# Patient Record
Sex: Male | Born: 1980 | Race: Black or African American | Hispanic: No | Marital: Single | Smoking: Former smoker
Health system: Southern US, Community
[De-identification: ages and names within clinical notes are randomized; demographics above are authoritative.]

## PROBLEM LIST (undated history)

## (undated) DIAGNOSIS — K5190 Ulcerative colitis, unspecified, without complications: Secondary | ICD-10-CM

## (undated) DIAGNOSIS — K921 Melena: Secondary | ICD-10-CM

## (undated) HISTORY — PX: WISDOM TOOTH EXTRACTION: SHX21

## (undated) HISTORY — DX: Melena: K92.1

---

## 1999-10-20 ENCOUNTER — Emergency Department (HOSPITAL_COMMUNITY): Admission: EM | Admit: 1999-10-20 | Discharge: 1999-10-20 | Payer: Self-pay | Admitting: Emergency Medicine

## 2007-08-12 ENCOUNTER — Emergency Department (HOSPITAL_COMMUNITY): Admission: EM | Admit: 2007-08-12 | Discharge: 2007-08-12 | Payer: Self-pay | Admitting: Emergency Medicine

## 2010-07-11 ENCOUNTER — Emergency Department (HOSPITAL_COMMUNITY): Admission: EM | Admit: 2010-07-11 | Discharge: 2010-07-12 | Payer: Self-pay | Admitting: Emergency Medicine

## 2010-07-13 ENCOUNTER — Emergency Department (HOSPITAL_COMMUNITY): Admission: EM | Admit: 2010-07-13 | Discharge: 2010-07-13 | Payer: Self-pay | Admitting: Emergency Medicine

## 2010-07-13 ENCOUNTER — Emergency Department (HOSPITAL_COMMUNITY): Admission: EM | Admit: 2010-07-13 | Discharge: 2010-07-13 | Payer: Self-pay | Admitting: Family Medicine

## 2010-11-20 LAB — CSF CELL COUNT WITH DIFFERENTIAL
RBC Count, CSF: 10 /mm3 — ABNORMAL HIGH
Tube #: 4

## 2010-11-20 LAB — POCT I-STAT, CHEM 8
Calcium, Ion: 1.09 mmol/L — ABNORMAL LOW (ref 1.12–1.32)
Chloride: 105 mEq/L (ref 96–112)
Glucose, Bld: 95 mg/dL (ref 70–99)
HCT: 44 % (ref 39.0–52.0)
TCO2: 25 mmol/L (ref 0–100)

## 2011-03-13 IMAGING — CT CT HEAD W/O CM
1 series · 16 of 30 positions shown, 20 images · non-contrast
Comparison: None

CLINICAL DATA: Lightheaded, near-syncope, pain

CT HEAD WITHOUT CONTRAST
TECHNIQUE: Contiguous axial images were obtained from the base of
the skull through the vertex without contrast.

[Series 2: head routine 4.8 h37s · axial · 0.46mm/px · z∈[+1050,+1204]mm · 16 of 36 slices shown, 20 images]
[im 2/36  brain]
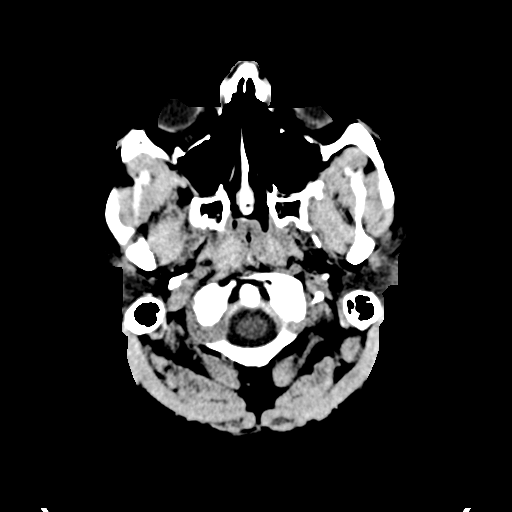
[im 2/36  bone]
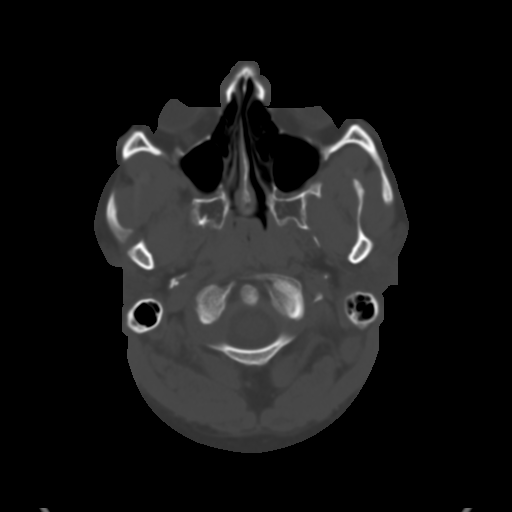
[im 4/36  brain]
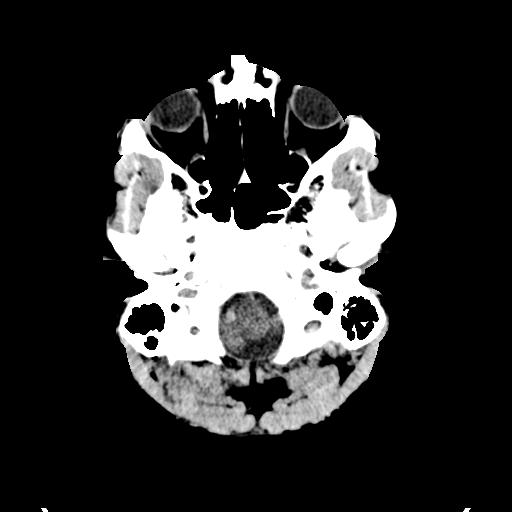
[im 7/36  brain]
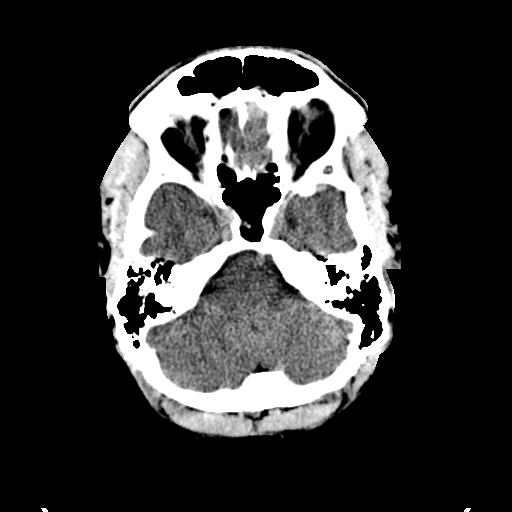
[im 9/36  brain]
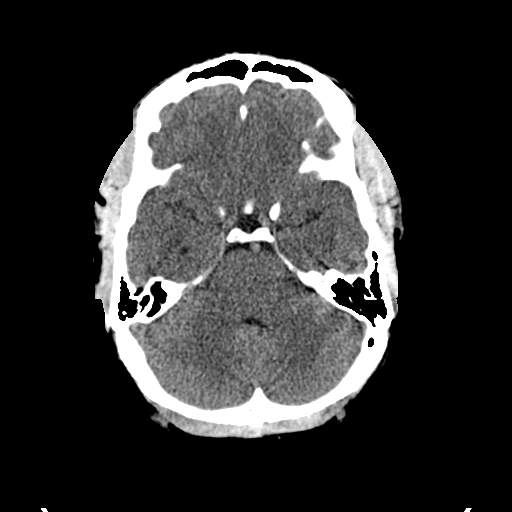
[im 10/36  brain]
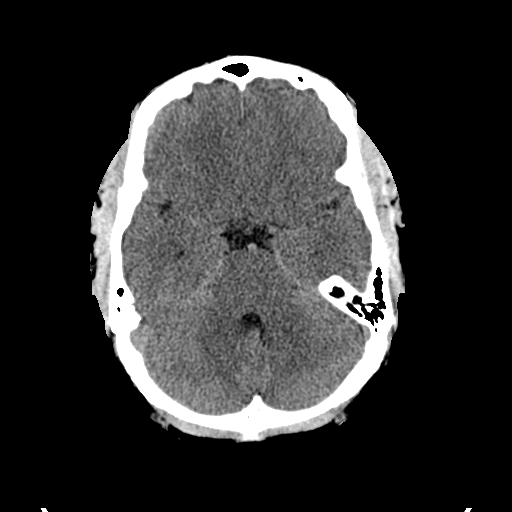
[im 10/36  bone]
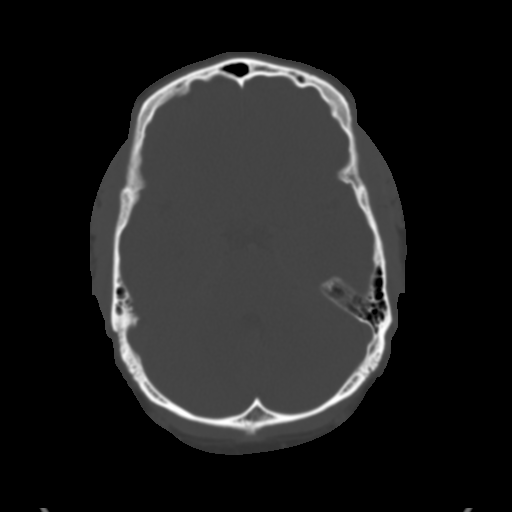
[im 13/36  brain]
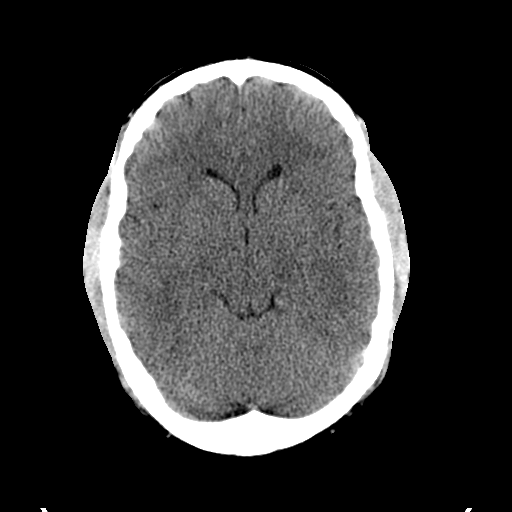
[im 15/36  brain]
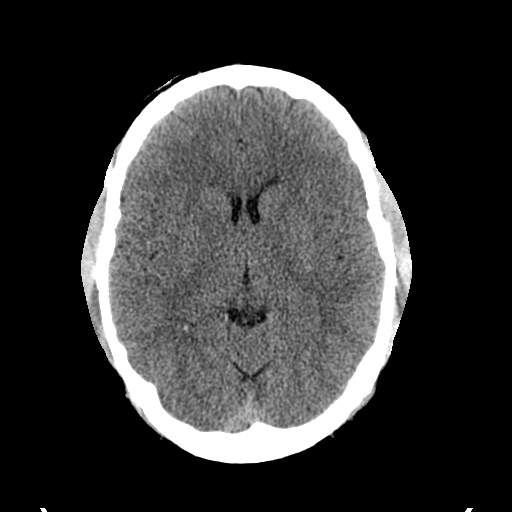
[im 17/36  brain]
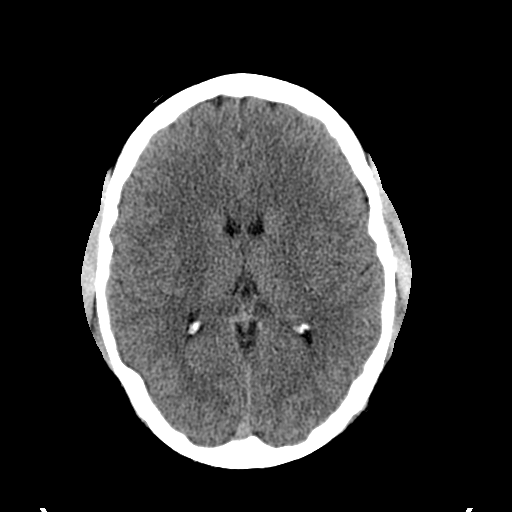
[im 19/36  brain]
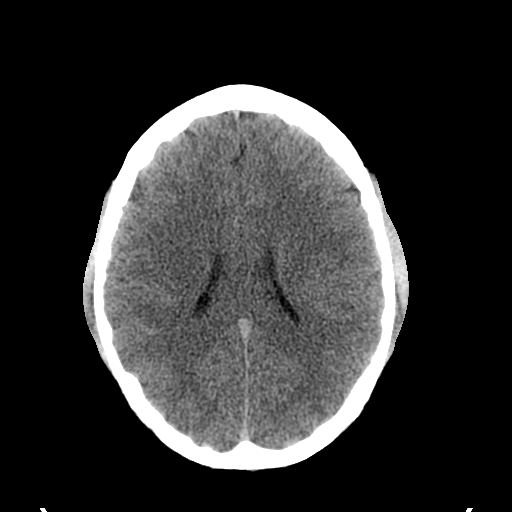
[im 19/36  bone]
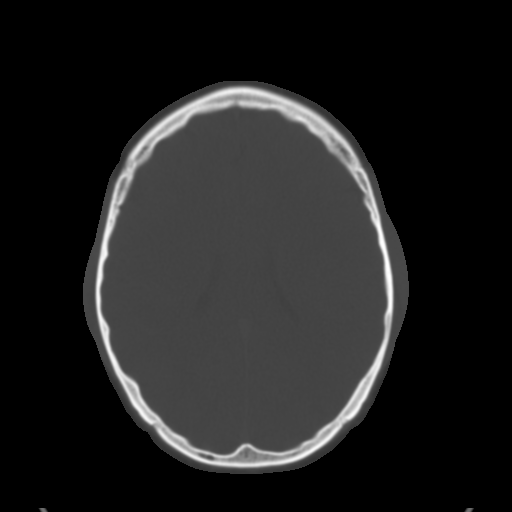
[im 21/36  brain]
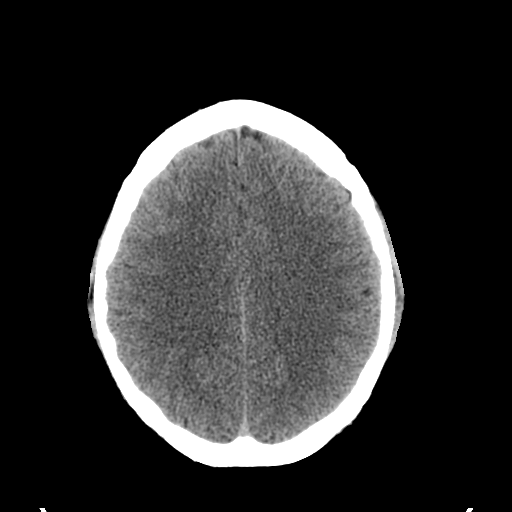
[im 23/36  brain]
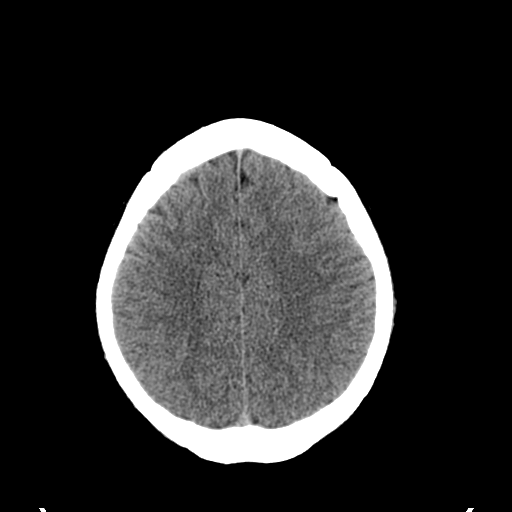
[im 26/36  brain]
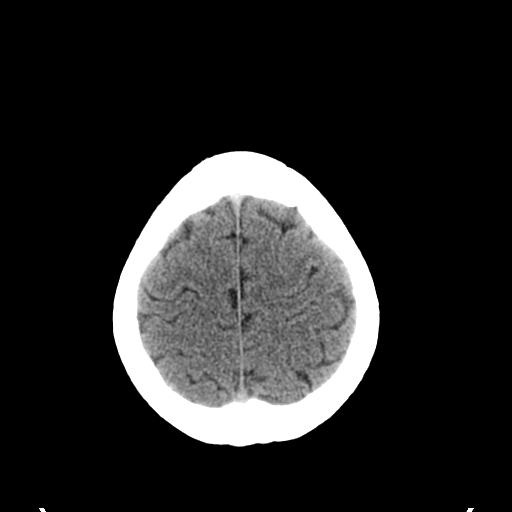
[im 27/36  brain]
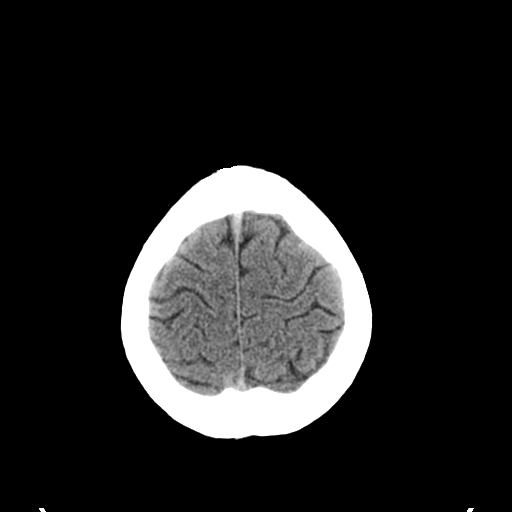
[im 27/36  bone]
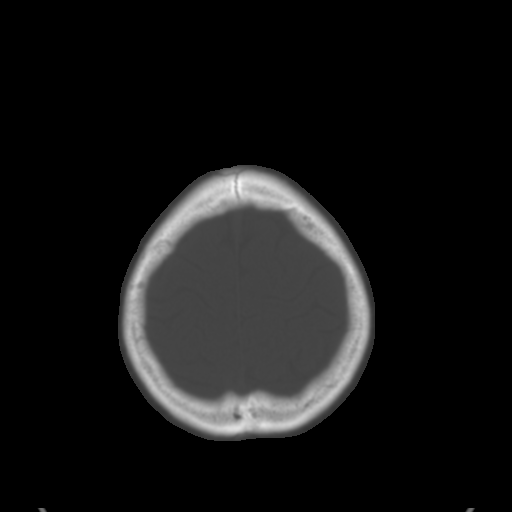
[im 29/36  brain]
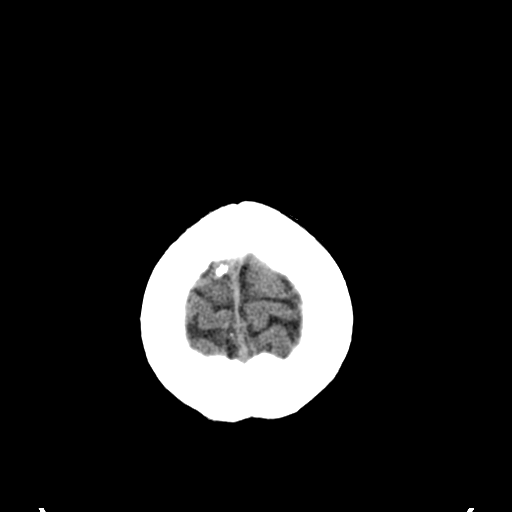
[im 32/36  brain]
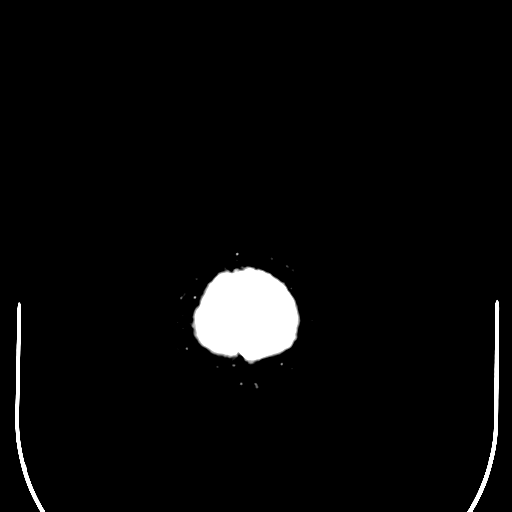
[im 34/36  brain]
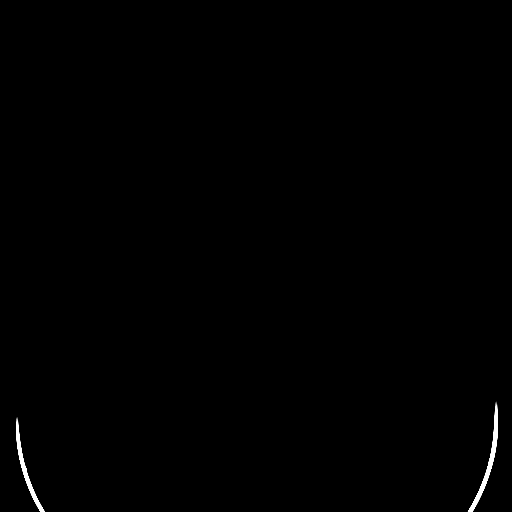

[16 of 30 positions shown; findings below may reference images not displayed]

FINDINGS: Normal ventricular morphology.
No midline shift or mass effect.
Normal appearance of brain parenchyma.
No intracranial hemorrhage, mass lesion, or acute infarction.
Visualized paranasal sinuses and mastoid air cells clear.
Bones unremarkable.
IMPRESSION: No acute intracranial abnormalities.
If patient has persistent symptoms, recommend follow-up MR imaging
of the brain with and without contrast to further assess.

## 2012-05-27 ENCOUNTER — Encounter (HOSPITAL_COMMUNITY): Payer: Self-pay

## 2012-05-27 ENCOUNTER — Emergency Department (INDEPENDENT_AMBULATORY_CARE_PROVIDER_SITE_OTHER)
Admission: EM | Admit: 2012-05-27 | Discharge: 2012-05-27 | Disposition: A | Discharge status: Home / Self Care | Source: Home / Self Care | Attending: Emergency Medicine | Admitting: Emergency Medicine

## 2012-05-27 DIAGNOSIS — J069 Acute upper respiratory infection, unspecified: Secondary | ICD-10-CM

## 2012-05-27 DIAGNOSIS — R0981 Nasal congestion: Secondary | ICD-10-CM

## 2012-05-27 DIAGNOSIS — J3489 Other specified disorders of nose and nasal sinuses: Secondary | ICD-10-CM

## 2012-05-27 HISTORY — DX: Ulcerative colitis, unspecified, without complications: K51.90

## 2012-05-27 MED ORDER — ACETAMINOPHEN-CODEINE #3 300-30 MG PO TABS: 1.0000 | ORAL_TABLET | Freq: Four times a day (QID) | ORAL | Status: DC | PRN

## 2012-05-27 MED ORDER — FEXOFENADINE-PSEUDOEPHED ER 60-120 MG PO TB12: 1.0000 | ORAL_TABLET | Freq: Two times a day (BID) | ORAL | Status: DC

## 2012-05-27 NOTE — ED Provider Notes (Signed)
Or History     CSN: [id number]  Arrival date & time 05/27/12  1503   First MD Initiated Contact with Patient 05/27/12 1542      Chief Complaint  Patient presents with  . Sore Throat  . Sinus Problem    (Consider location/radiation/quality/duration/timing/severity/associated sxs/prior treatment) HPI Comments: Patient presents urgent care complaining of sinus congestion headache runny nose sore throat for approximately 5 days. Described that he has had some tactile fevers at home and had a temperature of 1.100.2. Have been taken over-the-counter decongestants such as DayQuil and NyQuil. Denies any cough or difficulty breathing. No abdominal pain  Patient is a 31 y.o. male presenting with pharyngitis and sinus complaint. The history is provided by the patient.  Sore Throat This is a new problem. The current episode started more than 2 days ago. The problem occurs constantly. The problem has been gradually worsening. Pertinent negatives include no chest pain, no abdominal pain, no headaches and no shortness of breath. Nothing aggravates the symptoms. Nothing relieves the symptoms. He has tried nothing for the symptoms. The treatment provided no relief.  Sinus Problem Pertinent negatives include no chest pain, no abdominal pain, no headaches and no shortness of breath.    Past Medical History  Diagnosis Date  . Ulcerative colitis     History reviewed. No pertinent past surgical history.  No family history on file.  History  Substance Use Topics  . Smoking status: Current Every Day Smoker  . Smokeless tobacco: Not on file  . Alcohol Use: Yes      Review of Systems  Constitutional: Negative for chills, activity change and appetite change.  HENT: Positive for congestion, sore throat, rhinorrhea and sinus pressure. Negative for hearing loss, ear pain, facial swelling, trouble swallowing, neck pain, neck stiffness and postnasal drip.   Respiratory: Negative for cough and shortness  of breath.   Cardiovascular: Negative for chest pain.  Gastrointestinal: Negative for abdominal pain.  Skin: Negative for rash and wound.  Neurological: Negative for headaches.    Allergies  Review of patient's allergies indicates no known allergies.  Home Medications   Current Outpatient Rx  Name Route Sig Dispense Refill  . MESALAMINE 1.2 G PO TBEC Oral Take 1,200 mg by mouth 4 (four) times daily.    . ACETAMINOPHEN-CODEINE #3 300-30 MG PO TABS Oral Take 1-2 tablets by mouth every 6 (six) hours as needed for pain. 15 tablet 0  . FEXOFENADINE-PSEUDOEPHED ER 60-120 MG PO TB12 Oral Take 1 tablet by mouth every 12 (twelve) hours. 14 tablet 0    BP 123/71  Pulse 92  Temp 98.7 F (37.1 C) (Oral)  Resp 24  SpO2 99%  Physical Exam  Vitals reviewed. Constitutional: Vital signs are normal. He appears well-developed.  Non-toxic appearance. He does not have a sickly appearance. He does not appear ill. No distress.  HENT:  Head: Normocephalic.  Right Ear: Tympanic membrane normal.  Left Ear: Tympanic membrane normal.  Mouth/Throat: Uvula is midline and mucous membranes are normal. Posterior oropharyngeal erythema present. No oropharyngeal exudate or tonsillar abscesses.  Eyes: Conjunctivae normal are normal.  Neck: Neck supple. No JVD present.  Cardiovascular: Normal rate.  Exam reveals no gallop and no friction rub.   No murmur heard. Pulmonary/Chest: Effort normal and breath sounds normal. No respiratory distress. He has no decreased breath sounds. He has no wheezes. He has no rales.  Lymphadenopathy:    He has no cervical adenopathy.  Skin: No erythema.    ED  Course  Procedures (including critical care time)   Labs Reviewed  POCT RAPID STREP A ([facility] C[facility] ONLY)   No results found.   1. Sinus congestion   2. Upper respiratory infection       MDM  Patient  looks comfortable afebrile. With unremarkable exam. Symptoms consistent with sinus congestion most likely  viral process versus allergy-induced. Been symptomatic for 6 days. Patient has been prescribed Allegra-D along with Tylenol with Codeine for pressure and discomfort. Advised to return if symptoms were to persist beyond 7-10 days or worsening symptoms. This point patient exam her symptoms were indicative of need for antibiotics. Have discussed this with patient patient's agrees with treatment plan and followup care note and    Jimmie Molly, MD 05/27/12 1605

## 2012-05-27 NOTE — ED Notes (Signed)
C/o headache, nasal congestion and sore throat since Saturday.  States low grade fever on Sunday of 100

## 2012-10-19 ENCOUNTER — Emergency Department (INDEPENDENT_AMBULATORY_CARE_PROVIDER_SITE_OTHER)
Admission: EM | Admit: 2012-10-19 | Discharge: 2012-10-19 | Disposition: A | Discharge status: Home / Self Care | Source: Home / Self Care | Attending: Family Medicine | Admitting: Family Medicine

## 2012-10-19 ENCOUNTER — Encounter (HOSPITAL_COMMUNITY): Payer: Self-pay | Admitting: *Deleted

## 2012-10-19 DIAGNOSIS — S39012A Strain of muscle, fascia and tendon of lower back, initial encounter: Secondary | ICD-10-CM

## 2012-10-19 DIAGNOSIS — S335XXA Sprain of ligaments of lumbar spine, initial encounter: Secondary | ICD-10-CM

## 2012-10-19 MED ORDER — IBUPROFEN 800 MG PO TABS: 800.0000 mg | ORAL_TABLET | Freq: Three times a day (TID) | ORAL | Status: DC

## 2012-10-19 MED ORDER — CYCLOBENZAPRINE HCL 5 MG PO TABS: 5.0000 mg | ORAL_TABLET | Freq: Three times a day (TID) | ORAL | Status: DC | PRN

## 2012-10-19 NOTE — ED Notes (Signed)
Pt      Reports  Low  Back  Pain   With  Pain  Radiating      Down l  Leg        Symptoms  For  Several  Days     He  Ambulated to  Room  With a  Steady  Fluid  Gait

## 2012-10-19 NOTE — ED Provider Notes (Signed)
History     CSN: [id number]  Arrival date & time 10/19/12  1520   First MD Initiated Contact with Patient 10/19/12 1633      Chief Complaint  Patient presents with  . Back Pain    (Consider location/radiation/quality/duration/timing/severity/associated sxs/prior treatment) Patient is a 32 y.o. male presenting with back pain. The history is provided by the patient.  Back Pain Location:  Lumbar spine Quality:  Stabbing Radiates to:  L thigh Pain severity:  Mild Onset quality:  Sudden (while playing with daughter) Progression:  Unchanged Chronicity:  Recurrent Context: recent injury     Past Medical History  Diagnosis Date  . Ulcerative colitis     History reviewed. No pertinent past surgical history.  No family history on file.  History  Substance Use Topics  . Smoking status: Current Every Day Smoker  . Smokeless tobacco: Not on file  . Alcohol Use: Yes      Review of Systems  Constitutional: Negative.   Gastrointestinal: Negative.   Genitourinary: Negative.   Musculoskeletal: Positive for back pain. Negative for joint swelling and gait problem.  Skin: Negative.     Allergies  Review of patient's allergies indicates no known allergies.  Home Medications   Current Outpatient Rx  Name  Route  Sig  Dispense  Refill  . acetaminophen-codeine (TYLENOL #3) 300-30 MG per tablet   Oral   Take 1-2 tablets by mouth every 6 (six) hours as needed for pain.   15 tablet   0   . cyclobenzaprine (FLEXERIL) 5 MG tablet   Oral   Take 1 tablet (5 mg total) by mouth 3 (three) times daily as needed for muscle spasms.   30 tablet   0   . fexofenadine-pseudoephedrine (ALLEGRA-D) 60-120 MG per tablet   Oral   Take 1 tablet by mouth every 12 (twelve) hours.   14 tablet   0   . ibuprofen (ADVIL,MOTRIN) 800 MG tablet   Oral   Take 1 tablet (800 mg total) by mouth 3 (three) times daily.   30 tablet   0   . mesalamine (LIALDA) 1.2 G EC tablet   Oral   Take 1,200  mg by mouth 4 (four) times daily.           BP 130/84  Pulse 80  Temp(Src) 99 F (37.2 C) (Oral)  Resp 16  SpO2 98%  Physical Exam  Nursing note and vitals reviewed. Constitutional: He is oriented to person, place, and time. He appears well-developed and well-nourished.  Abdominal: Soft. Bowel sounds are normal. He exhibits no distension and no mass. There is no tenderness. There is no rebound and no guarding.  Musculoskeletal: He exhibits tenderness.       Lumbar back: He exhibits decreased range of motion, tenderness and spasm. He exhibits no pain.  Neurological: He is alert and oriented to person, place, and time.  Skin: Skin is warm and dry.    ED Course  Procedures (including critical care time)  Labs Reviewed - No data to display No results found.   1. Low back strain, initial encounter       MDM          Linna Hoff, MD 10/19/12 1730

## 2019-04-02 ENCOUNTER — Ambulatory Visit (INDEPENDENT_AMBULATORY_CARE_PROVIDER_SITE_OTHER): Admitting: Primary Care

## 2019-04-02 ENCOUNTER — Encounter: Payer: Self-pay | Admitting: Primary Care

## 2019-04-02 ENCOUNTER — Other Ambulatory Visit: Payer: Self-pay

## 2019-04-02 VITALS — BP 118/82 | HR 77 | Temp 97.9°F | Ht 68.25 in | Wt 200.5 lb

## 2019-04-02 DIAGNOSIS — K5190 Ulcerative colitis, unspecified, without complications: Secondary | ICD-10-CM | POA: Insufficient documentation

## 2019-04-02 DIAGNOSIS — R0683 Snoring: Secondary | ICD-10-CM | POA: Insufficient documentation

## 2019-04-02 DIAGNOSIS — K51919 Ulcerative colitis, unspecified with unspecified complications: Secondary | ICD-10-CM | POA: Diagnosis not present

## 2019-04-02 NOTE — Assessment & Plan Note (Signed)
Following with GI in [facility]. Continue mesalamine as prescribed.

## 2019-04-02 NOTE — Progress Notes (Signed)
Subjective:    Patient ID: Craig Rios, male    DOB: 1981-03-18, 38 y.o.   MRN: [id number]  HPI  Craig Rios is a 38 year old male who presents today to establish care and discuss the problems mentioned below. Will obtain/review records.  1) Ulcerative Colitis: Currently managed on mesalamine 1.2 g tablets daily (taking 4 total daily). Diagnosed in 2010. [place] in [place]. Last colonoscopy was in 2019 and was unchanged.  He follows with GI annually and has an appointment scheduled for fall 2020.  2) Snoring: He would like to look into obtaining a sleep study as he snores nightly which is very loud. He has been told about this for years from his significant other. He denies known periods of apnea during the night, daytime drowsiness, waking up gasping for air, fatigue, palpitations.  Epworth Sleepiness Scale score of 15 today.  BP Readings from Last 3 Encounters:  04/02/19 118/82  10/19/12 130/84  05/27/12 123/71     Review of Systems  Constitutional: Negative for fatigue.  Respiratory: Negative for shortness of breath.        Snoring   Cardiovascular: Negative for chest pain.  Gastrointestinal: Negative for abdominal pain.  Neurological: Negative for dizziness and headaches.       Past Medical History:  Diagnosis Date  . Blood in stool   . Ulcerative colitis (HCC)      Social History   Socioeconomic History  . Marital status: Single    Spouse name: Not on file  . Number of children: Not on file  . Years of education: Not on file  . Highest education level: Not on file  Occupational History  . Not on file  Social Needs  . Financial resource strain: Not on file  . Food insecurity    Worry: Not on file    Inability: Not on file  . Transportation needs    Medical: Not on file    Non-medical: Not on file  Tobacco Use  . Smoking status: Former Games developermoker  . Smokeless tobacco: Never Used  Substance and Sexual Activity  . Alcohol use: Yes  . Drug  use: No  . Sexual activity: Not on file  Lifestyle  . Physical activity    Days per week: Not on file    Minutes per session: Not on file  . Stress: Not on file  Relationships  . Social Musicianconnections    Talks on phone: Not on file    Gets together: Not on file    Attends religious service: Not on file    Active member of club or organization: Not on file    Attends meetings of clubs or organizations: Not on file    Relationship status: Not on file  . Intimate partner violence    Fear of current or ex partner: Not on file    Emotionally abused: Not on file    Physically abused: Not on file    Forced sexual activity: Not on file  Other Topics Concern  . Not on file  Social History Narrative   Single.   Works for [employer].   2 children.    History reviewed. No pertinent surgical history.  Family History  Problem Relation Age of Onset  . Stroke Mother   . Heart attack Father     No Known Allergies  Current Outpatient Medications on File Prior to Visit  Medication Sig Dispense Refill  . mesalamine (LIALDA) 1.2 G EC tablet Take  1,200 mg by mouth 4 (four) times daily.     No current facility-administered medications on file prior to visit.     BP 118/82   Pulse 77   Temp 97.9 F (36.6 C) (Temporal)   Ht 5' 8.25" (1.734 m)   Wt 200 lb 8 oz (90.9 kg)   SpO2 97%   BMI 30.26 kg/m    Objective:   Physical Exam  Constitutional: He appears well-nourished.  Neck: Neck supple.  Cardiovascular: Normal rate and regular rhythm.  Respiratory: Effort normal and breath sounds normal.  Skin: Skin is warm and dry.  Psychiatric: He has a normal mood and affect.           Assessment & Plan:

## 2019-04-02 NOTE — Assessment & Plan Note (Signed)
Chronic for years, snores pretty loudly according to patient. Epworth Sleepiness Scale score of 15 today. Referral placed to pulmonology for sleep study and evaluation.

## 2019-04-02 NOTE — Patient Instructions (Signed)
You will be contacted regarding your referral to pulmonology for the sleep apnea evaluation.  Please let us know if you have not been contacted within one week.   It was a pleasure to meet you today! Please don't hesitate to call or message me with any questions. Welcome to [facility]!

## 2019-04-07 ENCOUNTER — Ambulatory Visit (INDEPENDENT_AMBULATORY_CARE_PROVIDER_SITE_OTHER): Admitting: Internal Medicine

## 2019-04-07 DIAGNOSIS — G4719 Other hypersomnia: Secondary | ICD-10-CM

## 2019-04-07 NOTE — Progress Notes (Signed)
[facility] Consultation    Virtual Visit via Video Note I connected with patient on 04/07/19 at 11:00 AM EDT by video and verified that I am speaking with the correct person using two identifiers.   I discussed the limitations, risks of performing an evaluation and management service by video and the availability of in person appointments. I also discussed with the patient that there may be a patient responsible charge related to this service.  In light of current covid-19 pandemic, patient also understands that we are trying to protect them by minimizing in office contact if at all possible.  The patient expressed understanding and agreed to proceed. Please see note below for further detail.    The patient was advised to call back or seek an in-person evaluation if the symptoms worsen or if the condition fails to improve as anticipated. I spent 25 minutes of face-to-face time during this visit.   Shane CrutchPradeep Ladan Vanderzanden, MD   Assessment and Plan:  Excessive daytime sleepiness. - Symptoms and signs of obstructive sleep apnea with daytime sleepiness, witnessed apneas. - We will send for sleep study. - Patient thinks he would prefer to use a mandibular advancement device over CPAP.  If sleep study positive will refer to a dentist.  Orders Placed This Encounter  Procedures  . Home sleep test     Date: 04/07/2019  MRN# [id number] Craig FraterChristopher Rios 11-28-1980  Referring Physician: Allayne GitelmanK. Clark NP.   Craig FraterChristopher Fant is a 38 y.o. old male seen in consultation for chief complaint of: daytime sleepiness.     HPI:  Craig Rios is a 38 y.o. old male he has been having people tell him that he snores loudly, his wife told that he stops breathing in his sleep. He has been sleepy during the day.  He goes to bed at 11pm and watches tv until, falls asleep with TV around [place]. Wakes at 630 am.  On off days wakes at same time.  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  Denies  sleep paralysis, cataplexy, sleep walking. Denies jaw pain, no TMJ, no dentures.    PMHX:   Past Medical History:  Diagnosis Date  . Blood in stool   . Ulcerative colitis (HCC)    Surgical Hx:  No past surgical history on file. Family Hx:  Family History  Problem Relation Age of Onset  . Stroke Mother   . Heart attack Father    Social Hx:   Social History   Tobacco Use  . Smoking status: Former Games developermoker  . Smokeless tobacco: Never Used  Substance Use Topics  . Alcohol use: Yes  . Drug use: No   Medication:    Current Outpatient Medications:  .  mesalamine (LIALDA) 1.2 G EC tablet, Take 1,200 mg by mouth 4 (four) times daily., Disp: , Rfl:    Allergies:  Patient has no known allergies.  Review of Systems: Gen:  Denies  fever, sweats, chills HEENT: Denies blurred vision, double vision. bleeds, sore throat Cvc:  No dizziness, chest pain. Resp:   Denies cough or sputum production, shortness of breath Gi: Denies swallowing difficulty, stomach pain. Gu:  Denies bladder incontinence, burning urine Ext:   No Joint pain, stiffness. Skin: No skin rash,  hives  Endoc:  No polyuria, polydipsia. Psych: No depression, insomnia. Other:  All other systems were reviewed with the patient and were negative other that what is mentioned in the HPI.       LABORATORY PANEL:  CBC No results for input(s): WBC, HGB, HCT, PLT in the last 168 hours. ------------------------------------------------------------------------------------------------------------------  Chemistries  No results for input(s): NA, K, CL, CO2, GLUCOSE, BUN, CREATININE, CALCIUM, MG, AST, ALT, ALKPHOS, BILITOT in the last 168 hours.  Invalid input(s): GFRCGP ------------------------------------------------------------------------------------------------------------------  Cardiac Enzymes No results for input(s): TROPONINI in the last 168 hours. ------------------------------------------------------------   RADIOLOGY:  No results found.     Thank  you for the consultation and for allowing [facility] to assist in the care of your patient. Our recommendations are noted above.  Please contact us if we can be of further service.   Marda Stalker, M.D., F.C.C.P.  Board Certified in Internal Medicine, Pulmonary Medicine, [place], and Sleep Medicine.  [facility] Office Number: [phone]   04/07/2019

## 2019-04-07 NOTE — Patient Instructions (Signed)

## 2019-09-24 ENCOUNTER — Telehealth: Payer: Self-pay | Admitting: Primary Care

## 2019-09-24 NOTE — Telephone Encounter (Signed)
Patient called in regards to his referral with Pulmonary  He stated that he has not heard back from anyone in regards to this.  Can you check on this for him ?

## 2019-09-27 ENCOUNTER — Other Ambulatory Visit: Payer: Self-pay

## 2019-09-27 ENCOUNTER — Encounter: Payer: Self-pay | Admitting: Acute Care

## 2019-09-27 ENCOUNTER — Ambulatory Visit (INDEPENDENT_AMBULATORY_CARE_PROVIDER_SITE_OTHER): Admitting: Acute Care

## 2019-09-27 VITALS — BP 122/80 | HR 100 | Temp 97.9°F | Ht 68.0 in | Wt 202.8 lb

## 2019-09-27 DIAGNOSIS — R400 Somnolence: Secondary | ICD-10-CM | POA: Diagnosis not present

## 2019-09-27 DIAGNOSIS — Z87891 Personal history of nicotine dependence: Secondary | ICD-10-CM

## 2019-09-27 NOTE — Progress Notes (Signed)
History of Present Illness Craig Rios is a 39 y.o. male with signs and symptoms of OSA with daytime sleepiness and witnessed sleep apneas. He was seen by Dr. Ashby Rios 03/2019 for consult. He will be reassigned to Dr. Mortimer Rios.    09/27/2019  Pt. Presents for follow up of sleep study. He was seen by Dr. Ashby Rios 03/2019 with symptoms of loud snoring, and witnessed sleep apnea. He endorses daytime sleepiness. He goes to bed at 11pm and watches tv until, falls asleep with TV around [place]. Wakes at 630 am.  On off days wakes at same time.  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  Denies sleep paralysis, cataplexy, sleep walking. Denies jaw pain, no TMJ, no dentures. He does not have a significant family history of OSA. Upon researching Epic for results the sleep study has not been done. 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 . We will order the sleep study and follow up appointment.   Test Results:   CBC Latest Ref Rng & Units 07/11/2010  Hemoglobin 13.0 - 17.0 g/dL 15.0  Hematocrit 39.0 - 52.0 % 44.0    BMP Latest Ref Rng & Units 07/11/2010  Glucose 70 - 99 mg/dL 95  BUN 6 - 23 mg/dL 11  Creatinine 0.4 - 1.5 mg/dL 1.3  Sodium 135 - 145 mEq/L 140  Potassium 3.5 - 5.1 mEq/L 3.2(L)  Chloride 96 - 112 mEq/L 105    BNP No results found for: BNP  ProBNP No results found for: PROBNP  PFT No results found for: FEV1PRE, FEV1POST, FVCPRE, FVCPOST, TLC, DLCOUNC, PREFEV1FVCRT, PSTFEV1FVCRT  No results found.   Past medical hx Past Medical History:  Diagnosis Date  . Blood in stool   . Ulcerative colitis ([place])      Social History   Tobacco Use  . Smoking status: Former Research scientist (life sciences)  . Smokeless tobacco: Never Used  Substance Use Topics  . Alcohol use: Yes  . Drug use: No    Mr.Craig Rios reports that he has quit smoking. He has never used smokeless tobacco. He reports current alcohol use. He reports that  he does not use drugs.  Tobacco Cessation: Former smoker , Quit 2017 ( Smoked 1 cigarette daily x 15 years)  Past surgical hx, Family hx, Social hx all reviewed.  Current Outpatient Medications on File Prior to Visit  Medication Sig  . mesalamine (LIALDA) 1.2 G EC tablet Take 1,200 mg by mouth 4 (four) times daily.   No current facility-administered medications on file prior to visit.     No Known Allergies  Review Of Systems:  Constitutional:   No  weight loss, night sweats,  Fevers, chills, fatigue, or  lassitude.  HEENT:   No headaches,  Difficulty swallowing,  Tooth/dental problems, or  Sore throat,                No sneezing, itching, ear ache, nasal congestion, post nasal drip,   CV:  No chest pain,  Orthopnea, PND, swelling in lower extremities, anasarca, dizziness, palpitations, syncope.   GI  No heartburn, indigestion, abdominal pain, nausea, vomiting, diarrhea, change in bowel habits, loss of appetite, bloody stools.   Resp: No shortness of breath with exertion or at rest.  No excess mucus, no productive cough,  No non-productive cough,  No coughing up of blood.  No change in color of mucus.  No wheezing.  No chest wall deformity  Skin:  no rash or lesions.  GU: no dysuria, change in color of urine, no urgency or frequency.  No flank pain, no hematuria   MS:  No joint pain or swelling.  No decreased range of motion.  No back pain.  Psych:  No change in mood or affect. No depression or anxiety.  No memory loss.   Vital Signs BP 122/80 (BP Location: Left Arm, Patient Position: Sitting, Cuff Size: Large)   Pulse 100   Temp 97.9 F (36.6 C) (Temporal)   Ht 5\' 8"  (1.727 m)   Wt 202 lb 12.8 oz (92 kg)   SpO2 97% Comment: on ra  BMI 30.84 kg/m    Physical Exam:  General- No distress,  A&Ox3, pleasant ENT: No sinus tenderness, TM clear, pale nasal mucosa, no oral exudate,no post nasal drip, no LAN Cardiac: S1, S2, regular rate and rhythm, no murmur Chest: No  wheeze/ rales/ dullness; no accessory muscle use, no nasal flaring, no sternal retractions Abd.: Soft Non-tender, ND, BS + Ext: No clubbing cyanosis, edema Neuro:  normal strength, MAE x 4, A&O x 3 Skin: No rashes, warm and dry, no lesions Psych: normal mood and behavior   Assessment/Plan Suspected OSA Daytime Sleepiness  Plan We will get your Home Sleep Study Scheduled Once we have the results we will schedule an appointment to review the results and determine need for therapy. Please call the office if you have not heard from in 1 week to ensure the sleep study gets scheduled. [phone])  Please contact office for sooner follow up if symptoms do not improve or worsen or seek emergency care    I provided 30 minutes of care today   ( ([phone], NP 09/27/2019  3:17 PM

## 2019-09-27 NOTE — Patient Instructions (Addendum)
It is good to meet you today We will get your Home Sleep Study Scheduled Once we have the results we will schedule an appointment to review the results Please call the office if you have not heard from [place] in 1 week. [phone]) You will pick the machine up from the [place] office at [address], [place], [place]. [address] Please contact office for sooner follow up if symptoms do not improve or worsen or seek emergency care.

## 2019-09-30 ENCOUNTER — Encounter: Payer: Self-pay | Admitting: Primary Care

## 2019-09-30 ENCOUNTER — Ambulatory Visit (INDEPENDENT_AMBULATORY_CARE_PROVIDER_SITE_OTHER): Admitting: Primary Care

## 2019-09-30 VITALS — Ht 68.0 in | Wt 202.0 lb

## 2019-09-30 DIAGNOSIS — Z3009 Encounter for other general counseling and advice on contraception: Secondary | ICD-10-CM | POA: Diagnosis not present

## 2019-09-30 NOTE — Patient Instructions (Signed)
You will be contacted regarding your referral to Urology.  Please let us know if you have not been contacted within two weeks.   It was a pleasure to see you today! Mayra Reel, NP-C

## 2019-09-30 NOTE — Progress Notes (Signed)
Subjective:    Patient ID: Craig Rios, male    DOB: 1980-12-03, 39 y.o.   MRN: [id number]  HPI  Virtual Visit via Video Note  I connected with Craig Rios on 09/30/19 at  8:20 AM EST by a video enabled telemedicine application and verified that I am speaking with the correct person using two identifiers.  Location: Patient: Home Provider: Office   I discussed the limitations of evaluation and management by telemedicine and the availability of in person appointments. The patient expressed understanding and agreed to proceed.  History of Present Illness:  Craig Rios is a 39 year old male with a history of ulcerative colitis who presents today to discuss vasectomy.   He has three living children and desires sterilization. He and his significant other are not using forms of birth control now. He denies penile/testicular swelling, pain, discharge.  He spoke with his insurance who stated he needed to be seen at [facility] in [place].   Observations/Objective:  Alert and oriented. Appears well, not sickly. No distress. Speaking in complete sentences.  Assessment and Plan:  Patient requesting sterilization in the form of vasectomy. Referral placed to Urology.  Follow Up Instructions:  You will be contacted regarding your referral to Urology.  Please let us know if you have not been contacted within two weeks.   It was a pleasure to see you today! Mayra Reel, NP-C    I discussed the assessment and treatment plan with the patient. The patient was provided an opportunity to ask questions and all were answered. The patient agreed with the plan and demonstrated an understanding of the instructions.   The patient was advised to call back or seek an in-person evaluation if the symptoms worsen or if the condition fails to improve as anticipated.    Doreene Nest, NP    Review of Systems  Genitourinary: Negative for discharge, dysuria, penile  pain, penile swelling, scrotal swelling and testicular pain.       Desires vasectomy        Past Medical History:  Diagnosis Date  . Blood in stool   . Ulcerative colitis (HCC)      Social History   Socioeconomic History  . Marital status: Single    Spouse name: Not on file  . Number of children: Not on file  . Years of education: Not on file  . Highest education level: Not on file  Occupational History  . Not on file  Tobacco Use  . Smoking status: Former Smoker    Packs/day: 0.50    Years: 15.00    Pack years: 7.50  . Smokeless tobacco: Never Used  Substance and Sexual Activity  . Alcohol use: Yes  . Drug use: No  . Sexual activity: Not on file  Other Topics Concern  . Not on file  Social History Narrative   Single.   Works for [employer].   2 children.   Social Determinants of Health   Financial Resource Strain:   . Difficulty of Paying Living Expenses: Not on file  Food Insecurity:   . Worried About Programme researcher, broadcasting/film/video in the Last Year: Not on file  . Ran Out of Food in the Last Year: Not on file  Transportation Needs:   . Lack of Transportation (Medical): Not on file  . Lack of Transportation (Non-Medical): Not on file  Physical Activity:   . Days of Exercise per Week: Not on file  . Minutes of  Exercise per Session: Not on file  Stress:   . Feeling of Stress : Not on file  Social Connections:   . Frequency of Communication with Friends and Family: Not on file  . Frequency of Social Gatherings with Friends and Family: Not on file  . Attends Religious Services: Not on file  . Active Member of Clubs or Organizations: Not on file  . Attends Archivist Meetings: Not on file  . Marital Status: Not on file  Intimate Partner Violence:   . Fear of Current or Ex-Partner: Not on file  . Emotionally Abused: Not on file  . Physically Abused: Not on file  . Sexually Abused: Not on file    No past surgical history on file.  Family History    Problem Relation Age of Onset  . Stroke Mother   . Heart attack Father     No Known Allergies  Current Outpatient Medications on File Prior to Visit  Medication Sig Dispense Refill  . mesalamine (LIALDA) 1.2 G EC tablet Take 1,200 mg by mouth 4 (four) times daily.     No current facility-administered medications on file prior to visit.    Ht 5\' 8"  (1.727 m)   Wt 202 lb (91.6 kg)   BMI 30.71 kg/m    Objective:   Physical Exam  Constitutional: He is oriented to person, place, and time. He appears well-nourished.  Respiratory: Effort normal.  Neurological: He is alert and oriented to person, place, and time.  Psychiatric: He has a normal mood and affect.           Assessment & Plan:

## 2019-11-04 ENCOUNTER — Other Ambulatory Visit: Payer: Self-pay

## 2019-11-04 ENCOUNTER — Ambulatory Visit: Payer: 59

## 2019-11-04 DIAGNOSIS — G4733 Obstructive sleep apnea (adult) (pediatric): Secondary | ICD-10-CM

## 2019-11-04 DIAGNOSIS — R400 Somnolence: Secondary | ICD-10-CM

## 2019-11-10 ENCOUNTER — Telehealth: Payer: Self-pay | Admitting: Pulmonary Disease

## 2019-11-10 DIAGNOSIS — G4733 Obstructive sleep apnea (adult) (pediatric): Secondary | ICD-10-CM

## 2019-11-10 NOTE — Telephone Encounter (Signed)
Lm x2 for pt.  

## 2019-11-10 NOTE — Telephone Encounter (Signed)
HST 11/04/19 >> AHI 39.1, SpO2 low 81%   Please inform him that his sleep study shows severe obstructive sleep apnea.  Please arrange for ROV with me or NP to discuss treatment options.

## 2019-11-10 NOTE — Telephone Encounter (Signed)
Lm for pt

## 2019-11-10 NOTE — Telephone Encounter (Signed)
Pt called back-- please return call [phone]

## 2019-11-11 NOTE — Telephone Encounter (Signed)
Spoke with pt, made him aware of results. Scheduled him in our [place] with NP Kandice Robinsons on 3/11 at 9am. Nothing further needed.

## 2019-11-17 NOTE — Progress Notes (Signed)
@Patient  ID: , male    DOB: [birth year], 39 y.o.   MRN: [id number]  Chief Complaint  Patient presents with  . Follow-up    F/U on sleep study results. Currently not using a cpap machine.     Referring provider: [id number], NP  HPI:  39 year old male former smoker followed in our office for severe obstructive sleep apnea  PMH: Ulcerative colitis Smoker/ Smoking History: Former smoker Maintenance: None Pt of: Dr. 24   11/18/2019  - Visit   39 year old male former smoker followed in our [place] office.  Patient presenting today to review home sleep study results those results are listed below:  11/04/2019-Home sleep study-AHI 39.1, SaO2 low 81%  Patient has no acute complaints today.  He is interested in reviewing these results as well as proceeding forward with treatment.  We will discuss this today.  Questionaires / Pulmonary Flowsheets:   Epworth:  Results of the Epworth flowsheet 04/07/2019  Sitting and reading 2  Watching TV 2  Sitting, inactive in a public place (e.g. a theatre or a meeting) 1  As a passenger in a car for an hour without a break 2  Lying down to rest in the afternoon when circumstances permit 2  Sitting and talking to someone 1  Sitting quietly after a lunch without alcohol 2  In a car, while stopped for a few minutes in traffic 1  Total score 13    Tests:   11/04/2019-Home sleep study-AHI 39.1, SaO2 low 81%  FENO:  No results found for: NITRICOXIDE  PFT: No flowsheet data found.  WALK:  No flowsheet data found.  Imaging: No results found.  Lab Results:  CBC    Component Value Date/Time   HGB 15.0 07/11/2010 2045   HCT 44.0 07/11/2010 2045    BMET    Component Value Date/Time   NA 140 07/11/2010 2045   K 3.2 (L) 07/11/2010 2045   CL 105 07/11/2010 2045   GLUCOSE 95 07/11/2010 2045   BUN 11 07/11/2010 2045   CREATININE 1.3 07/11/2010 2045    BNP No results found for: BNP  ProBNP No results  found for: PROBNP  Specialty Problems      Pulmonary Problems   Snoring   OSA (obstructive sleep apnea)    11/04/2019-Home sleep study-AHI 39.1, SaO2 low 81%          No Known Allergies   There is no immunization history on file for this patient.  Past Medical History:  Diagnosis Date  . Blood in stool   . Ulcerative colitis (HCC)     Tobacco History: Social History   Tobacco Use  Smoking Status Former Smoker  . Packs/day: 0.50  . Years: 15.00  . Pack years: 7.50  Smokeless Tobacco Never Used   Counseling given: Yes   Continue to not smoke  Outpatient Encounter Medications as of 11/18/2019  Medication Sig  . mesalamine (LIALDA) 1.2 G EC tablet Take 1,200 mg by mouth 4 (four) times daily.   No facility-administered encounter medications on file as of 11/18/2019.     Review of Systems  Review of Systems  Constitutional: Negative for activity change, chills, fatigue, fever and unexpected weight change.  HENT: Negative for postnasal drip, rhinorrhea, sinus pressure, sinus pain and sore throat.   Eyes: Negative.   Respiratory: Negative for cough, shortness of breath and wheezing.   Cardiovascular: Negative for chest pain and palpitations.  Gastrointestinal: Negative for constipation, diarrhea, nausea and vomiting.  Endocrine: Negative.   Genitourinary: Negative.   Musculoskeletal: Negative.   Skin: Negative.   Neurological: Negative for dizziness and headaches.  Psychiatric/Behavioral: Negative.  Negative for dysphoric mood. The patient is not nervous/anxious.   All other systems reviewed and are negative.    Physical Exam  BP 112/74 (BP Location: Left Arm, Patient Position: Sitting, Cuff Size: Normal)   Pulse 88   Temp 98.4 F (36.9 C) (Oral)   Ht 5\' 8"  (1.727 m)   Wt 201 lb (91.2 kg)   SpO2 99% Comment: on RA  BMI 30.56 kg/m   Wt Readings from Last 5 Encounters:  11/18/19 201 lb (91.2 kg)  09/30/19 202 lb (91.6 kg)  09/27/19 202 lb 12.8 oz  (92 kg)  04/02/19 200 lb 8 oz (90.9 kg)    BMI Readings from Last 5 Encounters:  11/18/19 30.56 kg/m  09/30/19 30.71 kg/m  09/27/19 30.84 kg/m  04/02/19 30.26 kg/m     Physical Exam Vitals and nursing note reviewed.  Constitutional:      General: He is not in acute distress.    Appearance: Normal appearance. He is normal weight.  HENT:     Head: Normocephalic and atraumatic.     Right Ear: Hearing and external ear normal.     Left Ear: Hearing and external ear normal.     Nose: No mucosal edema.     Right Turbinates: Not enlarged.     Left Turbinates: Not enlarged.     Mouth/Throat:     Mouth: Mucous membranes are dry.     Pharynx: Oropharynx is clear. No oropharyngeal exudate.     Comments: Mallampati 1 Eyes:     Pupils: Pupils are equal, round, and reactive to light.  Cardiovascular:     Rate and Rhythm: Normal rate and regular rhythm.     Pulses: Normal pulses.     Heart sounds: Normal heart sounds. No murmur.  Pulmonary:     Effort: Pulmonary effort is normal.     Breath sounds: Normal breath sounds. No decreased breath sounds, wheezing or rales.  Musculoskeletal:     Cervical back: Normal range of motion.  Lymphadenopathy:     Cervical: No cervical adenopathy.  Skin:    General: Skin is warm and dry.     Capillary Refill: Capillary refill takes less than 2 seconds.     Findings: No erythema or rash.  Neurological:     General: No focal deficit present.     Mental Status: He is alert and oriented to person, place, and time.     Motor: No weakness.     Coordination: Coordination normal.     Gait: Gait is intact. Gait normal.  Psychiatric:        Mood and Affect: Mood normal.        Behavior: Behavior normal. Behavior is cooperative.        Thought Content: Thought content normal.        Judgment: Judgment normal.       Assessment & Plan:   OSA (obstructive sleep apnea) Patient with severe obstructive sleep apnea Reviewed home sleep study results  as well as pathophysiology of obstructive sleep apnea with the patient Reviewed treatment options we will proceed forward with CPAP therapy today  Plan: New CPAP start DME: [facility] APAP setting 5-15 Follow-up in 2 months to show 30-day compliance using CPAP     Return in about 2 months (around 01/18/2020), or if symptoms worsen or fail to improve,  for Follow up with [name], [facility].   Coral Ceo, NP 11/18/2019   This appointment required 22 minutes of patient care (this includes precharting, chart review, review of results, face-to-face care, etc.).

## 2019-11-18 ENCOUNTER — Encounter: Payer: Self-pay | Admitting: Pulmonary Disease

## 2019-11-18 ENCOUNTER — Other Ambulatory Visit: Payer: Self-pay

## 2019-11-18 ENCOUNTER — Ambulatory Visit: Payer: 59 | Admitting: Acute Care

## 2019-11-18 ENCOUNTER — Ambulatory Visit (INDEPENDENT_AMBULATORY_CARE_PROVIDER_SITE_OTHER): Admitting: Pulmonary Disease

## 2019-11-18 DIAGNOSIS — G4733 Obstructive sleep apnea (adult) (pediatric): Secondary | ICD-10-CM | POA: Diagnosis not present

## 2019-11-18 NOTE — Assessment & Plan Note (Signed)
Patient with severe obstructive sleep apnea Reviewed home sleep study results as well as pathophysiology of obstructive sleep apnea with the patient Reviewed treatment options we will proceed forward with CPAP therapy today  Plan: New CPAP start DME: [facility] APAP setting 5-15 Follow-up in 2 months to show 30-day compliance using CPAP

## 2019-11-18 NOTE — Patient Instructions (Addendum)
You were seen today by Craig Ceo, NP  for:   It was nice meeting you today we reviewed your home sleep study which did show that you have severe obstructive sleep apnea.  We are recommending getting you started on CPAP therapy.  We will place that order today.  If no one has called you within the next 7 days please contact her office and let Craig Rios know.  We will see you back here in about 2 months to where I can see if at least 30 days compliance of using CPAP.  If you are having any difficulty using CPAP please do not hesitate to give Craig Rios a call.  Take care and stay safe,  Craig Rios  1. OSA (obstructive sleep apnea)  New CPAP start: DME: [facility] APAP setting 5-15 Mask of choice Supplies  We recommend that you start using your CPAP daily >>>Keep up the hard work using your device >>> Goal should be wearing this for the entire night that you are sleeping, at least 4 to 6 hours  Remember:  . Do not drive or operate heavy machinery if tired or drowsy.  . Please notify the supply company and office if you are unable to use your device regularly due to missing supplies or machine being broken.  . Work on maintaining a healthy weight and following your recommended nutrition plan  . Maintain proper daily exercise and movement  . Maintaining proper use of your device can also help improve management of other chronic illnesses such as: Blood pressure, blood sugars, and weight management.   BiPAP/ CPAP Cleaning:  >>>Clean weekly, with Dawn soap, and bottle brush.  Set up to air dry. >>> Wipe mask out daily with wet wipe or towelette    Follow Up:    Return in about 2 months (around 01/18/2020), or if symptoms worsen or fail to improve, for Follow up with Craig Headland FNP-C, [facility].   Please do your part to reduce the spread of COVID-19:      Reduce your risk of any infection  and COVID19 by using the similar precautions used for avoiding the common cold or flu:   Marland Kitchen Wash your hands often with soap and warm water for at least 20 seconds.  If soap and water are not readily available, use an alcohol-based hand sanitizer with at least 60% alcohol.  . If coughing or sneezing, cover your mouth and nose by coughing or sneezing into the elbow areas of your shirt or coat, into a tissue or into your sleeve (not your hands). Craig Rios A MASK when in public  . Avoid shaking hands with others and consider head nods or verbal greetings only. . Avoid touching your eyes, nose, or mouth with unwashed hands.  . Avoid close contact with people who are sick. . Avoid places or events with large numbers of people in one location, like concerts or sporting events. . If you have some symptoms but not all symptoms, continue to monitor at home and seek medical attention if your symptoms worsen. . If you are having a medical emergency, call 911.   ADDITIONAL HEALTHCARE OPTIONS FOR PATIENTS  Craig Telehealth / e-Visit: [email or web address]         [facility]: [phone]  [facility]: [phone]                   [facility]: [phone]     It is flu season:   >>>  Best ways to protect herself from the flu: Receive the yearly flu vaccine, practice good hand hygiene washing with soap and also using hand sanitizer when available, eat a nutritious meals, get adequate rest, hydrate appropriately   Please contact the office if your symptoms worsen or you have concerns that you are not improving.   Thank you for choosing [facility] for your healthcare, and for allowing Craig Rios to partner with you on your healthcare journey. I am thankful to be able to provide care to you today.   Craig Quaker FNP-C    Sleep Apnea Sleep apnea affects breathing during sleep. It causes breathing to stop for a short time or to become shallow. It can also increase the risk of:  Heart  attack.  Stroke.  Being very overweight (obese).  Diabetes.  Heart failure.  Irregular heartbeat. The goal of treatment is to help you breathe normally again. What are the causes? There are three kinds of sleep apnea:  Obstructive sleep apnea. This is caused by a blocked or collapsed airway.  Central sleep apnea. This happens when the brain does not send the right signals to the muscles that control breathing.  Mixed sleep apnea. This is a combination of obstructive and central sleep apnea. The most common cause of this condition is a collapsed or blocked airway. This can happen if:  Your throat muscles are too relaxed.  Your tongue and tonsils are too large.  You are overweight.  Your airway is too small. What increases the risk?  Being overweight.  Smoking.  Having a small airway.  Being older.  Being male.  Drinking alcohol.  Taking medicines to calm yourself (sedatives or tranquilizers).  Having family members with the condition. What are the signs or symptoms?  Trouble staying asleep.  Being sleepy or tired during the day.  Getting angry a lot.  Loud snoring.  Headaches in the morning.  Not being able to focus your mind (concentrate).  Forgetting things.  Less interest in sex.  Mood swings.  Personality changes.  Feelings of sadness (depression).  Waking up a lot during the night to pee (urinate).  Dry mouth.  Sore throat. How is this diagnosed?  Your medical history.  A physical exam.  A test that is done when you are sleeping (sleep study). The test is most often done in a sleep lab but may also be done at home. How is this treated?   Sleeping on your side.  Using a medicine to get rid of mucus in your nose (decongestant).  Avoiding the use of alcohol, medicines to help you relax, or certain pain medicines (narcotics).  Losing weight, if needed.  Changing your diet.  Not smoking.  Using a machine to open your  airway while you sleep, such as: ? An oral appliance. This is a mouthpiece that shifts your lower jaw forward. ? A CPAP device. This device blows air through a mask when you breathe out (exhale). ? An EPAP device. This has valves that you put in each nostril. ? A BPAP device. This device blows air through a mask when you breathe in (inhale) and breathe out.  Having surgery if other treatments do not work. It is important to get treatment for sleep apnea. Without treatment, it can lead to:  High blood pressure.  Coronary artery disease.  In men, not being able to have an erection (impotence).  Reduced thinking ability. Follow these instructions at home: Lifestyle  Make changes that your doctor  recommends.  Eat a healthy diet.  Lose weight if needed.  Avoid alcohol, medicines to help you relax, and some pain medicines.  Do not use any products that contain nicotine or tobacco, such as cigarettes, e-cigarettes, and chewing tobacco. If you need help quitting, ask your doctor. General instructions  Take over-the-counter and prescription medicines only as told by your doctor.  If you were given a machine to use while you sleep, use it only as told by your doctor.  If you are having surgery, make sure to tell your doctor you have sleep apnea. You may need to bring your device with you.  Keep all follow-up visits as told by your doctor. This is important. Contact a doctor if:  The machine that you were given to use during sleep bothers you or does not seem to be working.  You do not get better.  You get worse. Get help right away if:  Your chest hurts.  You have trouble breathing in enough air.  You have an uncomfortable feeling in your back, arms, or stomach.  You have trouble talking.  One side of your body feels weak.  A part of your face is hanging down. These symptoms may be an emergency. Do not wait to see if the symptoms will go away. Get medical help right  away. Call your local emergency services (911 in the [place] Do not drive yourself to the hospital. Summary  This condition affects breathing during sleep.  The most common cause is a collapsed or blocked airway.  The goal of treatment is to help you breathe normally while you sleep. This information is not intended to replace advice given to you by your health care provider. Make sure you discuss any questions you have with your health care provider. Document Revised: 06/12/2018 Document Reviewed: 04/21/2018 Elsevier Patient Education  2020 [employer].

## 2020-01-18 ENCOUNTER — Ambulatory Visit: Payer: 59 | Admitting: Pulmonary Disease

## 2020-01-18 NOTE — Progress Notes (Deleted)
@Patient  ID: , male    DOB: 07/09/1981, 39 y.o.   MRN: [id number]  No chief complaint on file.   Referring provider: [id number], NP  HPI:  39 year old male former smoker followed in our office for severe obstructive sleep apnea  PMH: Ulcerative colitis Smoker/ Smoking History: Former smoker Maintenance: None Pt of: Dr. 24   01/18/2020  - Visit     Questionaires / Pulmonary Flowsheets:   ACT:  No flowsheet data found.  MMRC: mMRC Dyspnea Scale mMRC Score  11/18/2019 0    Epworth:  Results of the Epworth flowsheet 04/07/2019  Sitting and reading 2  Watching TV 2  Sitting, inactive in a public place (e.g. a theatre or a meeting) 1  As a passenger in a car for an hour without a break 2  Lying down to rest in the afternoon when circumstances permit 2  Sitting and talking to someone 1  Sitting quietly after a lunch without alcohol 2  In a car, while stopped for a few minutes in traffic 1  Total score 13    Tests:   11/04/2019-Home sleep study-AHI 39.1, SaO2 low 81%   FENO:  No results found for: NITRICOXIDE  PFT: No flowsheet data found.  WALK:  No flowsheet data found.  Imaging: No results found.  Lab Results:  CBC    Component Value Date/Time   HGB 15.0 07/11/2010 2045   HCT 44.0 07/11/2010 2045    BMET    Component Value Date/Time   NA 140 07/11/2010 2045   K 3.2 (L) 07/11/2010 2045   CL 105 07/11/2010 2045   GLUCOSE 95 07/11/2010 2045   BUN 11 07/11/2010 2045   CREATININE 1.3 07/11/2010 2045    BNP No results found for: BNP  ProBNP No results found for: PROBNP  Specialty Problems      Pulmonary Problems   Snoring   OSA (obstructive sleep apnea)    11/04/2019-Home sleep study-AHI 39.1, SaO2 low 81%          No Known Allergies   There is no immunization history on file for this patient.  Past Medical History:  Diagnosis Date  . Blood in stool   . Ulcerative colitis (HCC)     Tobacco  History: Social History   Tobacco Use  Smoking Status Former Smoker  . Packs/day: 0.50  . Years: 15.00  . Pack years: 7.50  Smokeless Tobacco Never Used   Counseling given: Not Answered   Continue to not smoke  Outpatient Encounter Medications as of 01/19/2020  Medication Sig  . mesalamine (LIALDA) 1.2 G EC tablet Take 1,200 mg by mouth 4 (four) times daily.   No facility-administered encounter medications on file as of 01/19/2020.     Review of Systems  Review of Systems   Physical Exam  There were no vitals taken for this visit.  Wt Readings from Last 5 Encounters:  11/18/19 201 lb (91.2 kg)  09/30/19 202 lb (91.6 kg)  09/27/19 202 lb 12.8 oz (92 kg)  04/02/19 200 lb 8 oz (90.9 kg)    BMI Readings from Last 5 Encounters:  11/18/19 30.56 kg/m  09/30/19 30.71 kg/m  09/27/19 30.84 kg/m  04/02/19 30.26 kg/m     Physical Exam    Assessment & Plan:   No problem-specific Assessment & Plan notes found for this encounter.    No follow-ups on file.   04/04/19, NP 01/18/2020   This appointment required *** minutes of  patient care (this includes precharting, chart review, review of results, face-to-face care, etc.).

## 2020-01-19 ENCOUNTER — Ambulatory Visit: Admitting: Pulmonary Disease

## 2020-01-31 ENCOUNTER — Telehealth: Payer: Self-pay | Admitting: Pulmonary Disease

## 2020-01-31 NOTE — Telephone Encounter (Signed)
Synetta Fail did you get this CMN?

## 2020-02-01 NOTE — Telephone Encounter (Signed)
Yes I have this CMN

## 2020-02-02 NOTE — Telephone Encounter (Signed)
Arlys John has signed the 2 CMN's for this patient with an initial date start 11/29/2019. I have faxed both forms to [facility] and have put a copy in the folder for Melissa to pick up when she comes in the office next. I have received confirmation that the fax was received

## 2020-03-06 ENCOUNTER — Ambulatory Visit (INDEPENDENT_AMBULATORY_CARE_PROVIDER_SITE_OTHER): Admitting: Primary Care

## 2020-03-06 ENCOUNTER — Encounter: Payer: Self-pay | Admitting: Primary Care

## 2020-03-06 ENCOUNTER — Other Ambulatory Visit: Payer: Self-pay

## 2020-03-06 VITALS — BP 116/80 | HR 78 | Ht 68.0 in | Wt 206.0 lb

## 2020-03-06 DIAGNOSIS — R600 Localized edema: Secondary | ICD-10-CM

## 2020-03-06 HISTORY — DX: Localized edema: R60.0

## 2020-03-06 NOTE — Progress Notes (Signed)
Subjective:    Patient ID: Craig Rios, male    DOB: April 06, 1981, 39 y.o.   MRN: [id number]  HPI  This visit occurred during the SARS-CoV-2 public health emergency.  Safety protocols were in place, including screening questions prior to the visit, additional usage of staff PPE, and extensive cleaning of exam room while observing appropriate contact time as indicated for disinfecting solutions.   Craig Rios is a 39 year old male with a history of OSA, Ulcerative Colitis who presents today with a chief complaint of lower extremity edema.  He's noticed bilateral lower extremity edema, proximal to the ankles, for the last one month. Edema occurs with walking and vigorous exercise such as running. The edema will resolve with rest about 45 minutes later.  He has noted an obvious mass to the left lateral portion of his lower extremity.  He's tried calf stretching, compression socks without improvement. He endorses drinking mostly water. He denies changes in his diet, but admits to some fast food/salty foods.  He denies chest pain, shortness of breath, color changes.  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  BP Readings from Last 3 Encounters:  03/06/20 116/80  11/18/19 112/74  09/27/19 122/80     Review of Systems  Constitutional: Negative for fatigue.  Respiratory: Negative for shortness of breath.   Cardiovascular: Positive for leg swelling. Negative for chest pain.  Skin: Negative for color change.       Past Medical History:  Diagnosis Date  . Blood in stool   . Ulcerative colitis ([place])      Social History   Socioeconomic History  . Marital status: Single    Spouse name: Not on file  . Number of children: Not on file  . Years of education: Not on file  . Highest education level: Not on file  Occupational History  . Not on file  Tobacco Use  . Smoking  status: Former Smoker    Packs/day: 0.50    Years: 15.00    Pack years: 7.50  . Smokeless tobacco: Never Used  Vaping Use  . Vaping Use: Never used  Substance and Sexual Activity  . Alcohol use: Yes  . Drug use: No  . Sexual activity: Not on file  Other Topics Concern  . Not on file  Social History Narrative   Single.   Works for [employer].   2 children.   Social Determinants of Health   Financial Resource Strain:   . Difficulty of Paying Living Expenses:   Food Insecurity:   . Worried About Charity fundraiser in the Last Year:   . Arboriculturist in the Last Year:   Transportation Needs:   . Film/video editor (Medical):   Marland Kitchen Lack of Transportation (Non-Medical):   Physical Activity:   . Days of Exercise per Week:   . Minutes of Exercise per Session:   Stress:   . Feeling of Stress :   Social Connections:   . Frequency of Communication with Friends and Family:   . Frequency of Social Gatherings with Friends and Family:   . Attends Religious Services:   . Active Member of Clubs or Organizations:   . Attends Archivist Meetings:   Marland Kitchen Marital Status:   Intimate Partner Violence:   . Fear of Current or Ex-Partner:   . Emotionally Abused:   .  Physically Abused:   . Sexually Abused:     No past surgical history on file.  Family History  Problem Relation Age of Onset  . Stroke Mother   . Heart attack Father     No Known Allergies  Current Outpatient Medications on File Prior to Visit  Medication Sig Dispense Refill  . mesalamine (LIALDA) 1.2 G EC tablet Take 1,200 mg by mouth 4 (four) times daily.     No current facility-administered medications on file prior to visit.    BP 116/80   Pulse 78   Ht 5\' 8"  (1.727 m)   Wt 206 lb (93.4 kg)   SpO2 98%   BMI 31.32 kg/m    Objective:   Physical Exam Cardiovascular:     Rate and Rhythm: Normal rate and regular rhythm.     Pulses:          Dorsalis pedis pulses are 2+ on the right side and  2+ on the left side.       Posterior tibial pulses are 2+ on the right side and 2+ on the left side.     Comments: No lower extremity edema noted today.  I did however palpate a soft, nontender, superficial mass measuring 2 cm in diameter. Pulmonary:     Effort: Pulmonary effort is normal.     Breath sounds: Normal breath sounds.  Musculoskeletal:     Cervical back: Neck supple.  Skin:    General: Skin is warm and dry.            Assessment & Plan:

## 2020-03-06 NOTE — Patient Instructions (Signed)
Stop by the lab prior to leaving today. I will notify you of your results once received.   You will be contacted regarding your referral for the ultrasounds.  Please let us know if you have not been contacted within one week.  It was a pleasure to see you today!

## 2020-03-06 NOTE — Assessment & Plan Note (Signed)
Acute for the last month, only occurring with walking and exercise.  Exam today overall benign, see notes about mass. Normal pedal pulses, blood pressure under great control.  Labs pending. Bilateral venous and soft tissue ultrasounds pending. 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

## 2020-03-07 LAB — COMPREHENSIVE METABOLIC PANEL
ALT: 33 U/L (ref 0–53)
AST: 24 U/L (ref 0–37)
Albumin: 4.6 g/dL (ref 3.5–5.2)
Alkaline Phosphatase: 93 U/L (ref 39–117)
BUN: 11 mg/dL (ref 6–23)
CO2: 27 mEq/L (ref 19–32)
Calcium: 9.5 mg/dL (ref 8.4–10.5)
Chloride: 104 mEq/L (ref 96–112)
Creatinine, Ser: 1.11 mg/dL (ref 0.40–1.50)
GFR: 88.97 mL/min (ref >60.00)
Glucose, Bld: 101 mg/dL — ABNORMAL HIGH (ref 70–99)
Potassium: 4 mEq/L (ref 3.5–5.1)
Sodium: 139 mEq/L (ref 135–145)
Total Bilirubin: 0.8 mg/dL (ref 0.2–1.2)
Total Protein: 7.8 g/dL (ref 6.0–8.3)

## 2020-03-07 LAB — CBC
HCT: 44.1 % (ref 39.0–52.0)
Hemoglobin: 14.8 g/dL (ref 13.0–17.0)
MCHC: 33.4 g/dL (ref 30.0–36.0)
MCV: 90.1 fl (ref 78.0–100.0)
Platelets: 274 10*3/uL (ref 150.0–400.0)
RBC: 4.9 Mil/uL (ref 4.22–5.81)
RDW: 12.8 % (ref 11.5–15.5)
WBC: 6.6 10*3/uL (ref 4.0–10.5)

## 2020-03-10 ENCOUNTER — Telehealth: Payer: Self-pay | Admitting: Primary Care

## 2020-03-10 NOTE — Telephone Encounter (Signed)
Craig Rios, in ultrasound dept called about an order she received She wanted to verify what they should be scanning   What to make sure they are only scanning the Area of mass  And not doing a DVT scan   Patient has appointment on Tuesday, Craig Rios is requesting a call back Phone-[phone]

## 2020-03-10 NOTE — Telephone Encounter (Signed)
Kim and I connected via epic chat.

## 2020-03-14 ENCOUNTER — Ambulatory Visit: Admission: RE | Admit: 2020-03-14 | Source: Ambulatory Visit

## 2020-03-16 ENCOUNTER — Other Ambulatory Visit: Payer: Self-pay

## 2020-03-16 ENCOUNTER — Ambulatory Visit (INDEPENDENT_AMBULATORY_CARE_PROVIDER_SITE_OTHER)

## 2020-03-16 DIAGNOSIS — R600 Localized edema: Secondary | ICD-10-CM

## 2020-03-20 ENCOUNTER — Telehealth: Payer: Self-pay

## 2020-03-20 NOTE — Telephone Encounter (Signed)
Pt requesting if anything in office note from last week which could explain his lower extremity edema. Advised there is nothing in the note and this is why Jae Dire has ordered further testing. Advised pt of test results and PCP msg of results currently received and that this office would contact him with results not received yet. Pt verbalized understanding.

## 2020-03-20 NOTE — Telephone Encounter (Signed)
[facility] Night - Client Nonclinical Telephone Record  [facility] Client [facility] Night - Client Client Site [facility] - Night Physician Vernona Rieger - NP Contact Type Call Who Is Calling Patient / Member / Family / Caregiver Caller Name [name] Caller Phone Number [phone] Patient Name Craig Rios Patient DOB [birth year] Call Type Message Only Information Provided Reason for Call Request for General Office Information Initial Comment Caller was in last week, he wants a print out of visit. Additional Comment Disp. Time Disposition Final User 03/18/2020 10:03:51 AM General Information Provided Yes Osborne Casco Call Closed By: Osborne Casco Transaction Date/Time: 03/18/2020 10:02:32 AM (ET)

## 2020-03-21 ENCOUNTER — Ambulatory Visit
Admission: RE | Admit: 2020-03-21 | Discharge: 2020-03-21 | Disposition: A | Discharge status: Home / Self Care | Source: Ambulatory Visit | Attending: Primary Care | Admitting: Primary Care

## 2020-03-21 ENCOUNTER — Other Ambulatory Visit: Payer: Self-pay

## 2020-03-21 DIAGNOSIS — R600 Localized edema: Secondary | ICD-10-CM

## 2020-03-22 ENCOUNTER — Encounter: Payer: Self-pay | Admitting: Primary Care

## 2020-11-16 ENCOUNTER — Ambulatory Visit: Admitting: Primary Care

## 2020-11-16 DIAGNOSIS — Z0289 Encounter for other administrative examinations: Secondary | ICD-10-CM

## 2020-11-21 IMAGING — US US EXTREM LOW*L* LIMITED
1 series · 10 of 10 positions shown · non-contrast
Comparison: None.

CLINICAL DATA: Focal mass left lower extremity

EXAM:
ULTRASOUND LEFT LOWER EXTREMITY LIMITED
TECHNIQUE: Ultrasound examination of the lower extremity soft tissues was
performed in the area of clinical concern.

[Series 1: us left lower extrem ltd soft tissue non vascular · 10 of 10 slices shown]
[im 1/10]
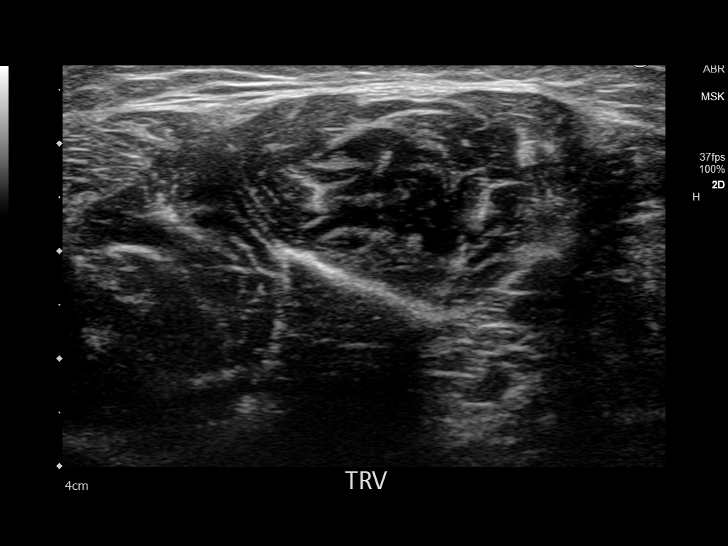
[im 2/10]
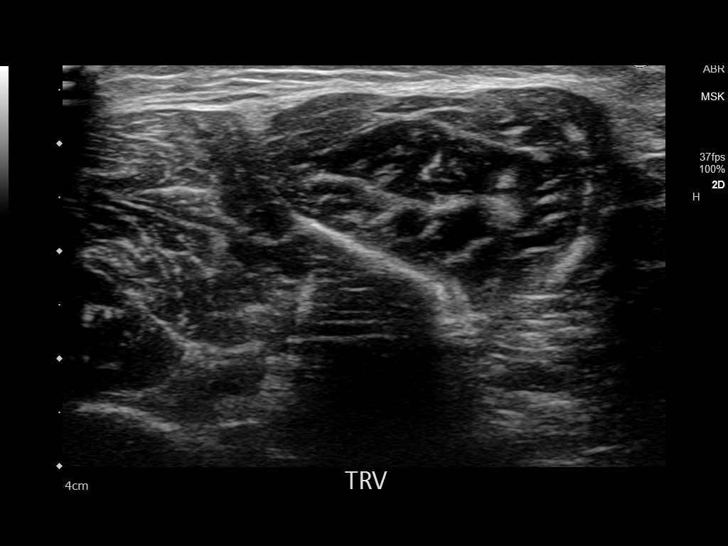
[im 3/10]
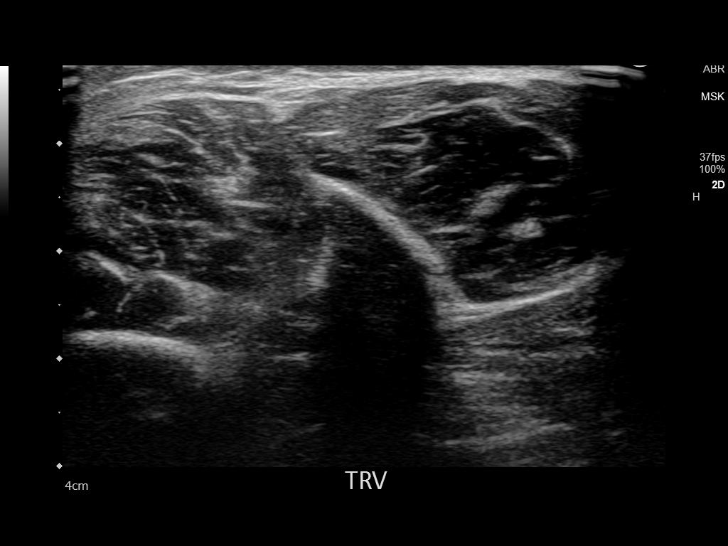
[im 4/10]
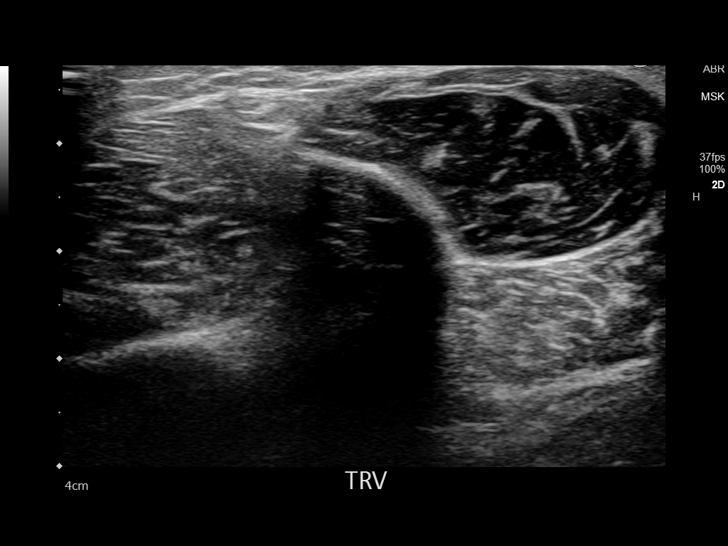
[im 5/10]
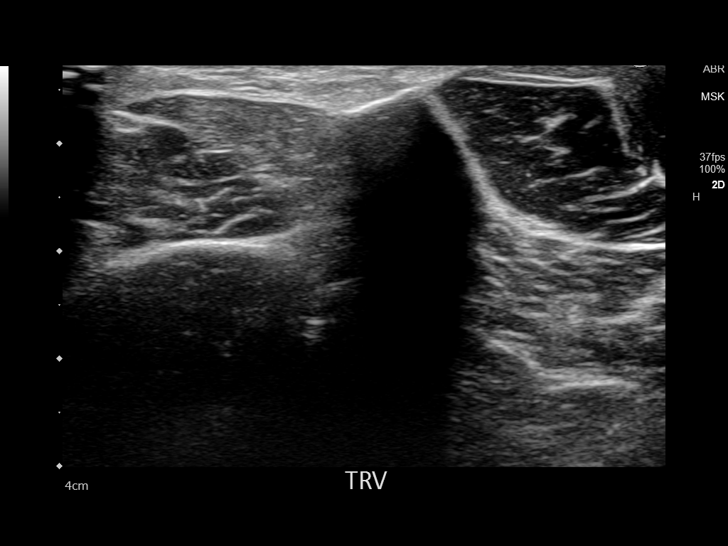
[im 6/10]
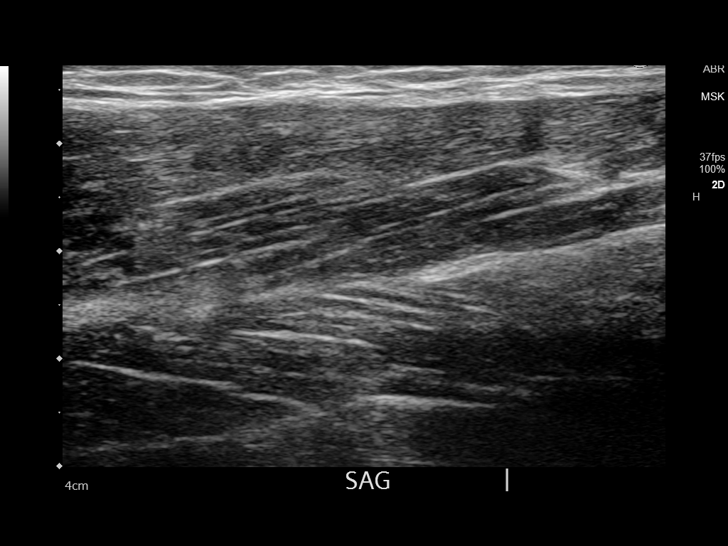
[im 7/10]
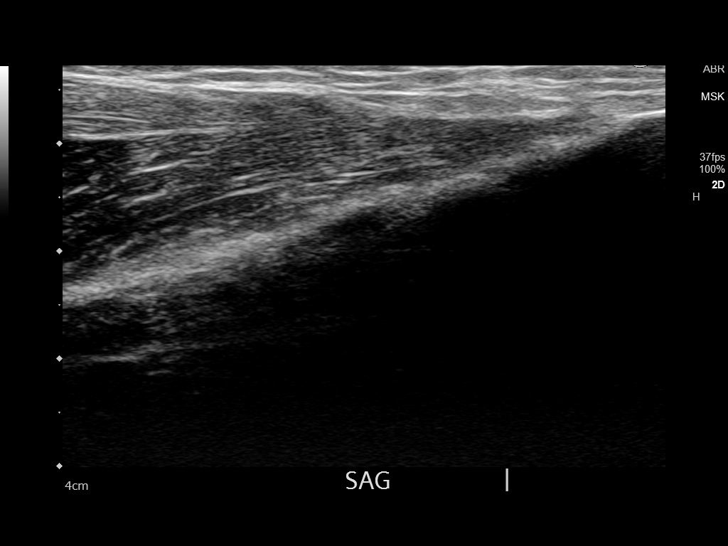
[im 8/10]
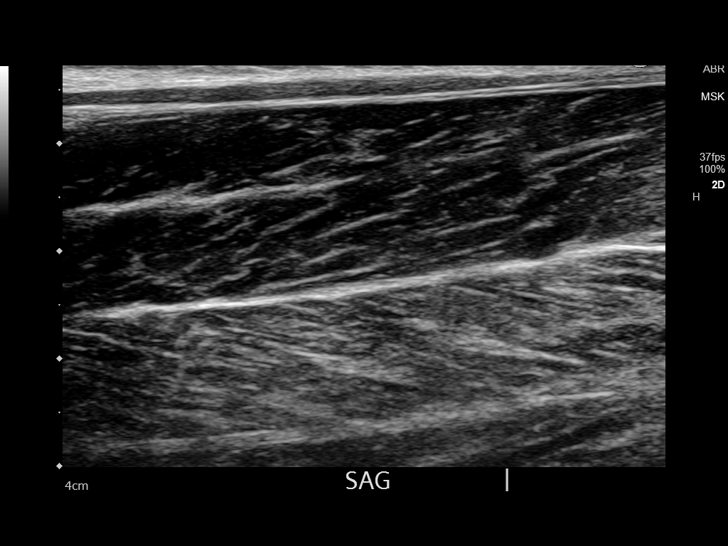
[im 9/10]
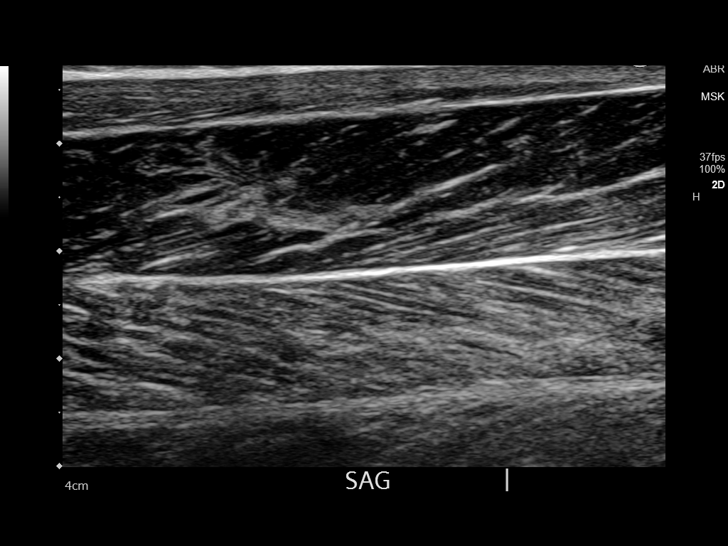
[im 10/10]
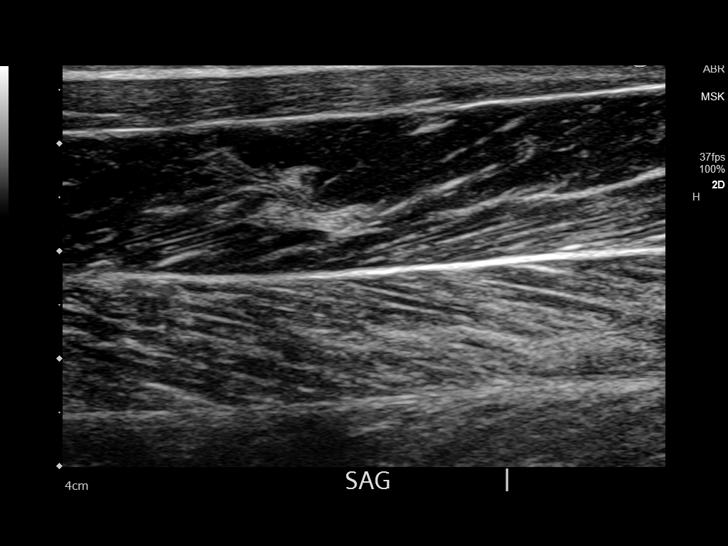

[10 of 10 positions shown; findings below may reference images not displayed]

FINDINGS: Ultrasound was performed in the area of concern in the left lower
lateral calf. No soft tissue abnormality seen. No soft tissue mass.
IMPRESSION: No abnormality seen in the area of concern.

## 2020-12-04 LAB — HM COLONOSCOPY

## 2021-02-12 ENCOUNTER — Encounter: Payer: Self-pay | Admitting: Primary Care

## 2021-05-07 NOTE — Progress Notes (Signed)
'@Patient'  ID: Craig Rios, male    DOB: 02-Feb-1981, 40 y.o.   MRN: [id number]  Chief Complaint  Patient presents with   Follow-up    OSA follow up per patient.     Referring provider: Pleas Koch, NP  HPI: 40 year old male, former smoker (7.5 pack year hx). PMH significant for OSA, ulcerative colitis. Former patient of Dr. Ashby Dawes, last see by pulmonary NP on 11/18/19. DME company is [employer]. HST 11/04/19 showed severe OSA, AHI 39.1/hour with SpO2 low 81%.  05/08/2021 Patient presents today for annual follow-up OSA. Needs CPAP supplies. He is doing well, no acute complaints. He is 70% compliant with CPAP use > 4 hours. No issues with mask fit, pressure setting or airleaks.  Airview download 04/07/21-05/06/21 24/30 days used; 70% > 4 hours Average usage days used 6 hours 33 mins Pressure 5-15cm h20 (13.8cm h20-95%) Airleaks 12.4L/min (95%) AHI 0.7   No Known Allergies  Immunization History  Administered Date(s) Administered   Anthrax 07/05/2011, 08/05/2011, 01/03/2012   Hepatitis A, Adult 10/20/2009, 08/17/2010   Hepatitis B, adult 10/20/2009, 08/17/2010, 07/05/2011   IPV 02/13/1999   Influenza Split 06/23/2010, 05/31/2011, 08/15/2012   Influenza, Seasonal, Injecte, Preservative Fre 06/25/2014, 07/15/2015   Influenza,inj,Quad PF,6+ Mos 06/06/2013, 08/16/2018, 10/16/2019   Influenza,inj,quad, With Preservative 06/22/2017   Influenza-Unspecified 09/21/2012, 06/13/2014, 07/13/2016, 08/12/2020   MMR 02/15/1999   Meningococcal Polysaccharide 02/13/1999   PFIZER(Purple Top)SARS-COV-2 Vaccination 12/20/2019, 01/12/2020   Td 02/13/1999   Tdap 10/20/2009, 11/13/2019   Typhoid Inactivated 07/05/2011    Past Medical History:  Diagnosis Date   Blood in stool    Ulcerative colitis ([place])     Tobacco History: Social History   Tobacco Use  Smoking Status Former   Packs/day: 0.50   Years: 15.00   Pack years: 7.50   Types: Cigarettes  Smokeless Tobacco Never    Counseling given: Not Answered   Outpatient Medications Prior to Visit  Medication Sig Dispense Refill   mesalamine (LIALDA) 1.2 G EC tablet Take 1,200 mg by mouth 4 (four) times daily.     HYDROCODONE-ACETAMINOPHEN PO HYDROCODONE-ACETAMINOPHEN (HYDROCODONE/ACETAMINOPHEN), 5MG-325MG, TABLET, ORAL, [place] PH, 50 Start Date: 10/29/19 Status: Ordered (Patient not taking: Reported on 05/08/2021)     No facility-administered medications prior to visit.   Review of Systems  Review of Systems  Constitutional: Negative.   HENT: Negative.    Respiratory: Negative.    Psychiatric/Behavioral:  Negative for sleep disturbance.     Physical Exam  BP 118/80 (BP Location: Left Arm, Patient Position: Sitting, Cuff Size: Large)   Pulse 79   Ht '5\' 8"'  (1.727 m)   Wt 209 lb (94.8 kg)   SpO2 96%   BMI 31.78 kg/m  Physical Exam Constitutional:      Appearance: Normal appearance.  HENT:     Head: Normocephalic and atraumatic.     Mouth/Throat:     Comments: Deferred d/t masking Cardiovascular:     Rate and Rhythm: Normal rate and regular rhythm.  Pulmonary:     Effort: Pulmonary effort is normal.     Breath sounds: Normal breath sounds.  Skin:    General: Skin is warm and dry.  Neurological:     General: No focal deficit present.     Mental Status: He is alert and oriented to person, place, and time. Mental status is at baseline.  Psychiatric:        Mood and Affect: Mood normal.        Behavior: Behavior normal.  Thought Content: Thought content normal.        Judgment: Judgment normal.     Lab Results:  CBC    Component Value Date/Time   WBC 6.6 03/06/2020 1519   RBC 4.90 03/06/2020 1519   HGB 14.8 03/06/2020 1519   HCT 44.1 03/06/2020 1519   PLT 274.0 03/06/2020 1519   MCV 90.1 03/06/2020 1519   MCHC 33.4 03/06/2020 1519   RDW 12.8 03/06/2020 1519    BMET    Component Value Date/Time   NA 139 03/06/2020 1519   K 4.0 03/06/2020 1519   CL 104  03/06/2020 1519   CO2 27 03/06/2020 1519   GLUCOSE 101 (H) 03/06/2020 1519   BUN 11 03/06/2020 1519   CREATININE 1.11 03/06/2020 1519   CALCIUM 9.5 03/06/2020 1519    BNP No results found for: BNP  ProBNP No results found for: PROBNP  Imaging: No results found.   Assessment & Plan:   OSA (obstructive sleep apnea) - HST 11/04/19 showed severe OSA, AHI 39.1/hour - Patient is 70% compliant with CPAP > 4 hours and reports benefit from use - Pressure 5-15cm h20; residual AHI 0.7 - Renew CPAP supplies with [employer] - Encourage patient maintain healthy weight and advised against driving if experiencing excessive daytime fatigue  - Follow-up in 1 year with [facility]/[place]    Martyn Ehrich, NP 05/08/2021

## 2021-05-08 ENCOUNTER — Other Ambulatory Visit: Payer: Self-pay

## 2021-05-08 ENCOUNTER — Encounter: Payer: Self-pay | Admitting: Primary Care

## 2021-05-08 ENCOUNTER — Ambulatory Visit (INDEPENDENT_AMBULATORY_CARE_PROVIDER_SITE_OTHER): Admitting: Primary Care

## 2021-05-08 VITALS — BP 118/80 | HR 79 | Ht 68.0 in | Wt 209.0 lb

## 2021-05-08 DIAGNOSIS — G4733 Obstructive sleep apnea (adult) (pediatric): Secondary | ICD-10-CM | POA: Diagnosis not present

## 2021-05-08 NOTE — Assessment & Plan Note (Signed)
-   HST 11/04/19 showed severe OSA, AHI 39.1/hour - Patient is 70% compliant with CPAP > 4 hours and reports benefit from use - Pressure 5-15cm h20; residual AHI 0.7 - Renew CPAP supplies with [employer] - Encourage patient maintain healthy weight and advised against driving if experiencing excessive daytime fatigue  - Follow-up in 1 year with [facility]/[place]

## 2021-05-08 NOTE — Progress Notes (Signed)
Reviewed and agree with assessment/plan.   Coralyn Helling, MD [facility] Pulmonary/Critical Care 05/08/2021, 2:09 PM Pager:  [phone]

## 2021-05-08 NOTE — Patient Instructions (Signed)
Recommendations: Continue to wear CPAP every night 4-6 hours or more Do not drive if excessively tired Maintain healthy weight  Orders: Renew CPAP supplies with [employer]  Follow-up:  1 year follow-up with APP    CPAP and BPAP Information CPAP and BPAP (also called BiPAP) are methods that use air pressure to keep your airways open and to help you breathe well. CPAP and BPAP use different amounts of pressure. Your health care provider will tell you whether CPAP or BPAP would be more helpful for you. CPAP stands for "continuous positive airway pressure." With CPAP, the amount of pressure stays the same while you breathe in (inhale) and out (exhale). BPAP stands for "bi-level positive airway pressure." With BPAP, the amount of pressure will be higher when you inhale and lower when you exhale. This allows you to take larger breaths. CPAP or BPAP may be used in the hospital, or your health care provider may want you to use it at home. You may need to have a sleep study before your healthcare provider can order a machine for you to use at home. What are the advantages? CPAP or BPAP can be helpful if you have: Sleep apnea. Chronic obstructive pulmonary disease (COPD). Heart failure. Medical conditions that cause muscle weakness, including muscular dystrophy or amyotrophic lateral sclerosis (ALS). Other problems that cause breathing to be shallow, weak, abnormal, or difficult. CPAP and BPAP are most commonly used for obstructive sleep apnea (OSA) to keepthe airways from collapsing when the muscles relax during sleep. What are the risks? Generally, this is a safe treatment. However, problems may occur, including: Irritated skin or skin sores if the mask does not fit properly. Dry or stuffy nose or nosebleeds. Dry mouth. Feeling gassy or bloated. Sinus or lung infection if the equipment is not cleaned properly. When should CPAP or BPAP be used? In most cases, the mask only needs to be worn during  sleep. Generally, the mask needs to be worn throughout the night and during any daytime naps. People with certain medical conditions may also need to wear the mask at other times, such as when they are awake. Follow instructions from your health care providerabout when to use the machine. What happens during CPAP or BPAP?  Both CPAP and BPAP are provided by a small machine with a flexible plastic tube that attaches to a plastic mask that you wear. Air is blown through the mask into your nose or mouth. The amount of pressure that is used to blow the air can be adjusted on the machine. Your health care provider will set the pressuresetting and help you find the best mask for you. Tips for using the mask Because the mask needs to be snug, some people feel trapped or closed-in (claustrophobic) when first using the mask. If you feel this way, you may need to get used to the mask. One way to do this is to hold the mask loosely over your nose or mouth and then gradually apply the mask more snugly. You can also gradually increase the amount of time that you use the mask. Masks are available in various types and sizes. If your mask does not fit well, talk with your health care provider about getting a different one. Some common types of masks include: Full face masks, which fit over the mouth and nose. Nasal masks, which fit over the nose. Nasal pillow or prong masks, which fit into the nostrils. If you are using a mask that fits over your nose  and you tend to breathe through your mouth, a chin strap may be applied to help keep your mouth closed. Use a skin barrier to protect your skin as told by your health care provider. Some CPAP and BPAP machines have alarms that may sound if the mask comes off or develops a leak. If you have trouble with the mask, it is very important that you talk with your health care provider about finding a way to make the mask easier to tolerate. Do not stop using the mask. There could  be a negative impact on your health if you stop using the mask. Tips for using the machine Place your CPAP or BPAP machine on a secure table or stand near an electrical outlet. Know where the on/off switch is on the machine. Follow instructions from your health care provider about how to set the pressure on your machine and when you should use it. Do not eat or drink while the CPAP or BPAP machine is on. Food or fluids could get pushed into your lungs by the pressure of the CPAP or BPAP. For home use, CPAP and BPAP machines can be rented or purchased through home health care companies. Many different brands of machines are available. Renting a machine before purchasing may help you find out which particular machine works well for you. Your health insurance company may also decide which machine you may get. Keep the CPAP or BPAP machine and attachments clean. Ask your health care provider for specific instructions. Check the humidifier if you have a dry stuffy nose or nosebleeds. Make sure it is working correctly. Follow these instructions at home: Take over-the-counter and prescription medicines only as told by your health care provider. Ask if you can take sinus medicine if your sinuses are blocked. Do not use any products that contain nicotine or tobacco. These products include cigarettes, chewing tobacco, and vaping devices, such as e-cigarettes. If you need help quitting, ask your health care provider. Keep all follow-up visits. This is important. Contact a health care provider if: You have redness or pressure sores on your head, face, mouth, or nose from the mask or head gear. You have trouble using the CPAP or BPAP machine. You cannot tolerate wearing the CPAP or BPAP mask. Someone tells you that you snore even when wearing your CPAP or BPAP. Get help right away if: You have trouble breathing. You feel confused. Summary CPAP and BPAP are methods that use air pressure to keep your airways  open and to help you breathe well. If you have trouble with the mask, it is very important that you talk with your health care provider about finding a way to make the mask easier to tolerate. Do not stop using the mask. There could be a negative impact to your health if you stop using the mask. Follow instructions from your health care provider about when to use the machine. This information is not intended to replace advice given to you by your health care provider. Make sure you discuss any questions you have with your healthcare provider. Document Revised: 08/04/2020 Document Reviewed: 08/04/2020 Elsevier Patient Education  2022 [employer].

## 2021-05-16 ENCOUNTER — Telehealth: Payer: Self-pay

## 2021-05-16 NOTE — Telephone Encounter (Signed)
Order received for supplies for C pap. Placed in your folder for review.

## 2021-05-16 NOTE — Telephone Encounter (Signed)
Patient follows with pulmonology, last visit 05/08/21. Patient has not been seen by me since June 2021. Fax orders to pulmonology.

## 2021-05-17 NOTE — Telephone Encounter (Signed)
Faxed back to supply company with note to send to pulmonology

## 2021-08-17 ENCOUNTER — Ambulatory Visit (INDEPENDENT_AMBULATORY_CARE_PROVIDER_SITE_OTHER)

## 2021-08-17 ENCOUNTER — Other Ambulatory Visit: Payer: Self-pay

## 2021-08-17 ENCOUNTER — Ambulatory Visit (INDEPENDENT_AMBULATORY_CARE_PROVIDER_SITE_OTHER): Admitting: Primary Care

## 2021-08-17 ENCOUNTER — Encounter: Payer: Self-pay | Admitting: Primary Care

## 2021-08-17 DIAGNOSIS — M545 Low back pain, unspecified: Secondary | ICD-10-CM

## 2021-08-17 DIAGNOSIS — G8929 Other chronic pain: Secondary | ICD-10-CM

## 2021-08-17 DIAGNOSIS — R21 Rash and other nonspecific skin eruption: Secondary | ICD-10-CM | POA: Insufficient documentation

## 2021-08-17 MED ORDER — TRIAMCINOLONE ACETONIDE 0.5 % EX OINT: 1.0000 "application " | TOPICAL_OINTMENT | Freq: Two times a day (BID) | CUTANEOUS | 0 refills | Status: DC

## 2021-08-17 MED ORDER — CYCLOBENZAPRINE HCL 5 MG PO TABS: 5.0000 mg | ORAL_TABLET | Freq: Three times a day (TID) | ORAL | 0 refills | Status: DC | PRN

## 2021-08-17 NOTE — Progress Notes (Signed)
Subjective:    Patient ID: Craig Rios, male    DOB: 12-23-1980, 40 y.o.   MRN: [id number]  HPI  Craig Rios is a very pleasant 40 y.o. male with a history of ulcerative colitis, OSA who presents today to discuss back pain and rash.  1) Chronic Back Pain: Chronic to the bilateral back for years (around 2010 he was in the [place] moving some heavy tents and suddenly felt pain to the lower back), will occur in episodes, typically feels like a "tightness or spasm" that will occurs with prolonged standing, bending frequently, or when sitting on a soft or unsupported chair/couch.   He will notice radiation of pain to the gluteal region with stiffness to the lower extremity. He will notice numbness to the legs intermittently with prolonged standing.   Historically during episodes he will "get a muscle relaxer" and rests which improves after a week of muscle relaxer and rest.   He underwent an xray several years ago with prior PCP, nothing was found. He's never tried PT in the past.   Today his symptoms are not present.   2) Rash: Chronic to the right lower extremity distal to the medial knee for the last one year. He noticed it after coming home from a trip in [place] one year prior. The spot is itchy and scaly, constant. He's been applying Gold [employer] Medicated lotion with temporary improvement.   No new lotions, detergents, soaps or shampoos. No new medicines, vitamins, supplements. No new pets. No recent outdoor exposure or poison ivy exposure. No bonfire or smoke exposure.  No recent motel or hotel stay or new beds.   No fevers/chills, oral lesions, new joint pains, tick bites, abdominal pain, nausea.    Review of Systems  Genitourinary:        Denies loss of bowel/bladder control.   Musculoskeletal:  Positive for back pain.  Skin:  Positive for rash.  Neurological:  Negative for weakness and numbness.        Past Medical History:  Diagnosis Date   Blood in stool     Ulcerative colitis ([place])     Social History   Socioeconomic History   Marital status: Single    Spouse name: Not on file   Number of children: Not on file   Years of education: Not on file   Highest education level: Not on file  Occupational History   Not on file  Tobacco Use   Smoking status: Former    Packs/day: 0.50    Years: 15.00    Pack years: 7.50    Types: Cigarettes   Smokeless tobacco: Never  Vaping Use   Vaping Use: Never used  Substance and Sexual Activity   Alcohol use: Yes   Drug use: No   Sexual activity: Not on file  Other Topics Concern   Not on file  Social History Narrative   Single.   Works for [employer].   2 children.   Social Determinants of Health   Financial Resource Strain: Not on file  Food Insecurity: Not on file  Transportation Needs: Not on file  Physical Activity: Not on file  Stress: Not on file  Social Connections: Not on file  Intimate Partner Violence: Not on file    Past Surgical History:  Procedure Laterality Date   WISDOM TOOTH EXTRACTION      Family History  Problem Relation Age of Onset   Stroke Mother    Heart attack Father  No Known Allergies  Current Outpatient Medications on File Prior to Visit  Medication Sig Dispense Refill   mesalamine (LIALDA) 1.2 G EC tablet Take 1,200 mg by mouth 4 (four) times daily.     No current facility-administered medications on file prior to visit.    BP 108/74   Pulse 88   Temp 98.3 F (36.8 C) (Temporal)   Ht 5\' 8"  (1.727 m)   Wt 212 lb (96.2 kg)   SpO2 98%   BMI 32.23 kg/m  Objective:   Physical Exam Cardiovascular:     Rate and Rhythm: Normal rate and regular rhythm.  Pulmonary:     Effort: Pulmonary effort is normal.     Breath sounds: No wheezing or rales.  Musculoskeletal:     Cervical back: Neck supple.     Lumbar back: No spasms or bony tenderness. Normal range of motion. Negative right straight leg raise test and negative left straight leg raise  test.       Back:  Skin:    General: Skin is warm and dry.     Findings: Rash present.     Comments: 3.5 cm X 1.5 cm scaly lesion to right medial lower extremity distal to knee.   Neurological:     Mental Status: He is alert and oriented to person, place, and time.          Assessment & Plan:      This visit occurred during the SARS-CoV-2 public health emergency.  Safety protocols were in place, including screening questions prior to the visit, additional usage of staff PPE, and extensive cleaning of exam room while observing appropriate contact time as indicated for disinfecting solutions.

## 2021-08-17 NOTE — Assessment & Plan Note (Signed)
Chronic for years.  Will start with xrays of lumbar spine. Referral placed to physical therapy.  Rx for cyclobenzaprine 5 mg sent to pharmacy to use PRN.   Consider MRI if no improvement after PT. He agrees.

## 2021-08-17 NOTE — Assessment & Plan Note (Signed)
Represents psoriasis.  Treat with triamcinolone 0.5% ointment BID X 1 week then as needed.  He will update.

## 2021-08-17 NOTE — Patient Instructions (Signed)
Apply the triamcinolone 0.5% ointment twice daily for about 1 week. Then use as needed.  You will be contacted regarding your referral to physical therapy.  Please let us know if you have not been contacted within two weeks.   Complete xray(s) prior to leaving today. I will notify you of your results once received.  You may take the cyclobenzaprine (muscle relaxer) every 8 hours as needed for spasms. This may cause drowsiness.   It was a pleasure to see you today!

## 2021-09-06 ENCOUNTER — Telehealth: Payer: Self-pay | Admitting: Primary Care

## 2021-09-06 NOTE — Telephone Encounter (Signed)
error 

## 2021-09-07 ENCOUNTER — Telehealth (INDEPENDENT_AMBULATORY_CARE_PROVIDER_SITE_OTHER): Admitting: Family

## 2021-09-07 ENCOUNTER — Encounter: Payer: Self-pay | Admitting: Family

## 2021-09-07 ENCOUNTER — Other Ambulatory Visit: Payer: Self-pay

## 2021-09-07 VITALS — Ht 68.0 in | Wt 212.0 lb

## 2021-09-07 DIAGNOSIS — J069 Acute upper respiratory infection, unspecified: Secondary | ICD-10-CM | POA: Insufficient documentation

## 2021-09-07 MED ORDER — AMOXICILLIN-POT CLAVULANATE 875-125 MG PO TABS: 1.0000 | ORAL_TABLET | Freq: Two times a day (BID) | ORAL | 0 refills | Status: AC

## 2021-09-07 NOTE — Patient Instructions (Signed)
Antibiotic sent to preferred pharmacy.   Please increase oral fluids, steamy hot shower/humidifier prn.  Please follow up if no improvement in 2-3 days.   It was a pleasure seeing you today! Please do not hesitate to reach out with any questions and or concerns.  Regards,   Keesha Pellum   

## 2021-09-07 NOTE — Assessment & Plan Note (Signed)
Antibiotic sent to pharmacy, Take antibiotic as prescribed. Increase oral fluids. Ok to continue with otc theraflu. Pt to f/u if sx worsen and or fail to improve in 2-3 days.

## 2021-09-07 NOTE — Progress Notes (Signed)
MyChart Video Visit    Virtual Visit via Video Note   This visit type was conducted due to national recommendations for restrictions regarding the COVID-19 Pandemic (e.g. social distancing) in an effort to limit this patient's exposure and mitigate transmission in our community. This patient is at least at moderate risk for complications without adequate follow up. This format is felt to be most appropriate for this patient at this time. Physical exam was limited by quality of the video and audio technology used for the visit. CMA was able to get the patient set up on a video visit.  Patient location: Home. Patient and provider in visit Provider location: Office  I discussed the limitations of evaluation and management by telemedicine and the availability of in person appointments. The patient expressed understanding and agreed to proceed.  Visit Date: 09/07/2021  Today's healthcare provider: Mort Sawyers, FNP     Subjective:    Patient ID: Craig Rios, male    DOB: 1981-09-09, 40 y.o.   MRN: [id number]  Chief Complaint  Patient presents with   Headache   Sore Throat   Fever   Cough   Nasal Congestion    Headache  Associated symptoms include coughing and a fever. Pertinent negatives include no ear pain or sore throat.  Sore Throat  Associated symptoms include congestion, coughing and headaches. Pertinent negatives include no ear pain or shortness of breath.  Fever  Associated symptoms include congestion, coughing and headaches. Pertinent negatives include no chest pain, ear pain, sore throat or wheezing.  Cough Associated symptoms include a fever and headaches. Pertinent negatives include no chest pain, chills, ear pain, sore throat, shortness of breath or wheezing.   40 y/o male with c/o nasal congestion, fever (not checking with thermometer, but cold sweats throughout the day and night), sore throat, headache, as well as a cough that is productive with sputum.  Sputum is yellow and brown. Sinus pressure. Did initially have a burning throat which has since resolved.   Sx have been for the last five days.  Theraflu and benadryl throughout the week without much relief.   UC: controlled with liadla, has had for ten years. Denies diarrhea/constipation.   Past Medical History:  Diagnosis Date   Blood in stool    Ulcerative colitis (HCC)     Past Surgical History:  Procedure Laterality Date   WISDOM TOOTH EXTRACTION      Family History  Problem Relation Age of Onset   Stroke Mother    Heart attack Father     Social History   Socioeconomic History   Marital status: Single    Spouse name: Not on file   Number of children: Not on file   Years of education: Not on file   Highest education level: Not on file  Occupational History   Not on file  Tobacco Use   Smoking status: Former    Packs/day: 0.50    Years: 15.00    Pack years: 7.50    Types: Cigarettes   Smokeless tobacco: Never  Vaping Use   Vaping Use: Never used  Substance and Sexual Activity   Alcohol use: Yes   Drug use: No   Sexual activity: Not on file  Other Topics Concern   Not on file  Social History Narrative   Single.   Works for [employer].   2 children.   Social Determinants of Health   Financial Resource Strain: Not on file  Food Insecurity: Not on file  Transportation Needs: Not on file  Physical Activity: Not on file  Stress: Not on file  Social Connections: Not on file  Intimate Partner Violence: Not on file    Outpatient Medications Prior to Visit  Medication Sig Dispense Refill   cyclobenzaprine (FLEXERIL) 5 MG tablet Take 1 tablet (5 mg total) by mouth 3 (three) times daily as needed for muscle spasms. 15 tablet 0   mesalamine (LIALDA) 1.2 G EC tablet Take 1,200 mg by mouth 4 (four) times daily.     triamcinolone ointment (KENALOG) 0.5 % Apply 1 application topically 2 (two) times daily. 30 g 0   No facility-administered medications prior  to visit.    No Known Allergies  Review of Systems  Constitutional:  Positive for fever. Negative for chills.  HENT:  Positive for congestion. Negative for ear pain and sore throat.   Respiratory:  Positive for cough and sputum production. Negative for shortness of breath and wheezing.   Cardiovascular:  Negative for chest pain.  Neurological:  Positive for headaches.  All other systems reviewed and are negative.     Objective:    Physical Exam Constitutional:      General: He is not in acute distress.    Appearance: He is well-developed and normal weight. He is not ill-appearing, toxic-appearing or diaphoretic.  HENT:     Head: Normocephalic.  Pulmonary:     Effort: Pulmonary effort is normal.  Neurological:     Mental Status: He is alert.  Psychiatric:        Mood and Affect: Mood normal.        Speech: Speech normal.        Behavior: Behavior normal.    Ht 5\' 8"  (1.727 m)    Wt 212 lb (96.2 kg)    BMI 32.23 kg/m  Wt Readings from Last 3 Encounters:  09/07/21 212 lb (96.2 kg)  08/17/21 212 lb (96.2 kg)  05/08/21 209 lb (94.8 kg)       Assessment & Plan:   Problem List Items Addressed This Visit       Respiratory   Upper respiratory infection, acute - Primary    Antibiotic sent to pharmacy, Take antibiotic as prescribed. Increase oral fluids. Ok to continue with otc theraflu. Pt to f/u if sx worsen and or fail to improve in 2-3 days.       Relevant Medications   amoxicillin-clavulanate (AUGMENTIN) 875-125 MG tablet    I am having Craig Rios start on amoxicillin-clavulanate. I am also having him maintain his mesalamine, triamcinolone ointment, and cyclobenzaprine.  Meds ordered this encounter  Medications   amoxicillin-clavulanate (AUGMENTIN) 875-125 MG tablet    Sig: Take 1 tablet by mouth 2 (two) times daily for 7 days.    Dispense:  14 tablet    Refill:  0    Order Specific Question:   Supervising Provider    Answer:   BEDSOLE, AMY E [[id number]]     I discussed the assessment and treatment plan with the patient. The patient was provided an opportunity to ask questions and all were answered. The patient agreed with the plan and demonstrated an understanding of the instructions.   The patient was advised to call back or seek an in-person evaluation if the symptoms worsen or if the condition fails to improve as anticipated.  I provided 16 minutes of face-to-face time during this encounter.   Eugenia Pancoast, [place] [facility] [phone] (phone) [phone] (fax)  [facility]

## 2021-09-11 ENCOUNTER — Telehealth: Admitting: Family Medicine

## 2021-12-12 LAB — HM COLONOSCOPY

## 2021-12-20 ENCOUNTER — Encounter: Payer: Self-pay | Admitting: Primary Care

## 2022-04-19 IMAGING — DX DG LUMBAR SPINE 2-3V
2 series · 2 of 2 positions shown · non-contrast
Comparison: None.

CLINICAL DATA: Chronic low back pain.

EXAM:
LUMBAR SPINE - 2-3 VIEW

[lumbar spine ap]
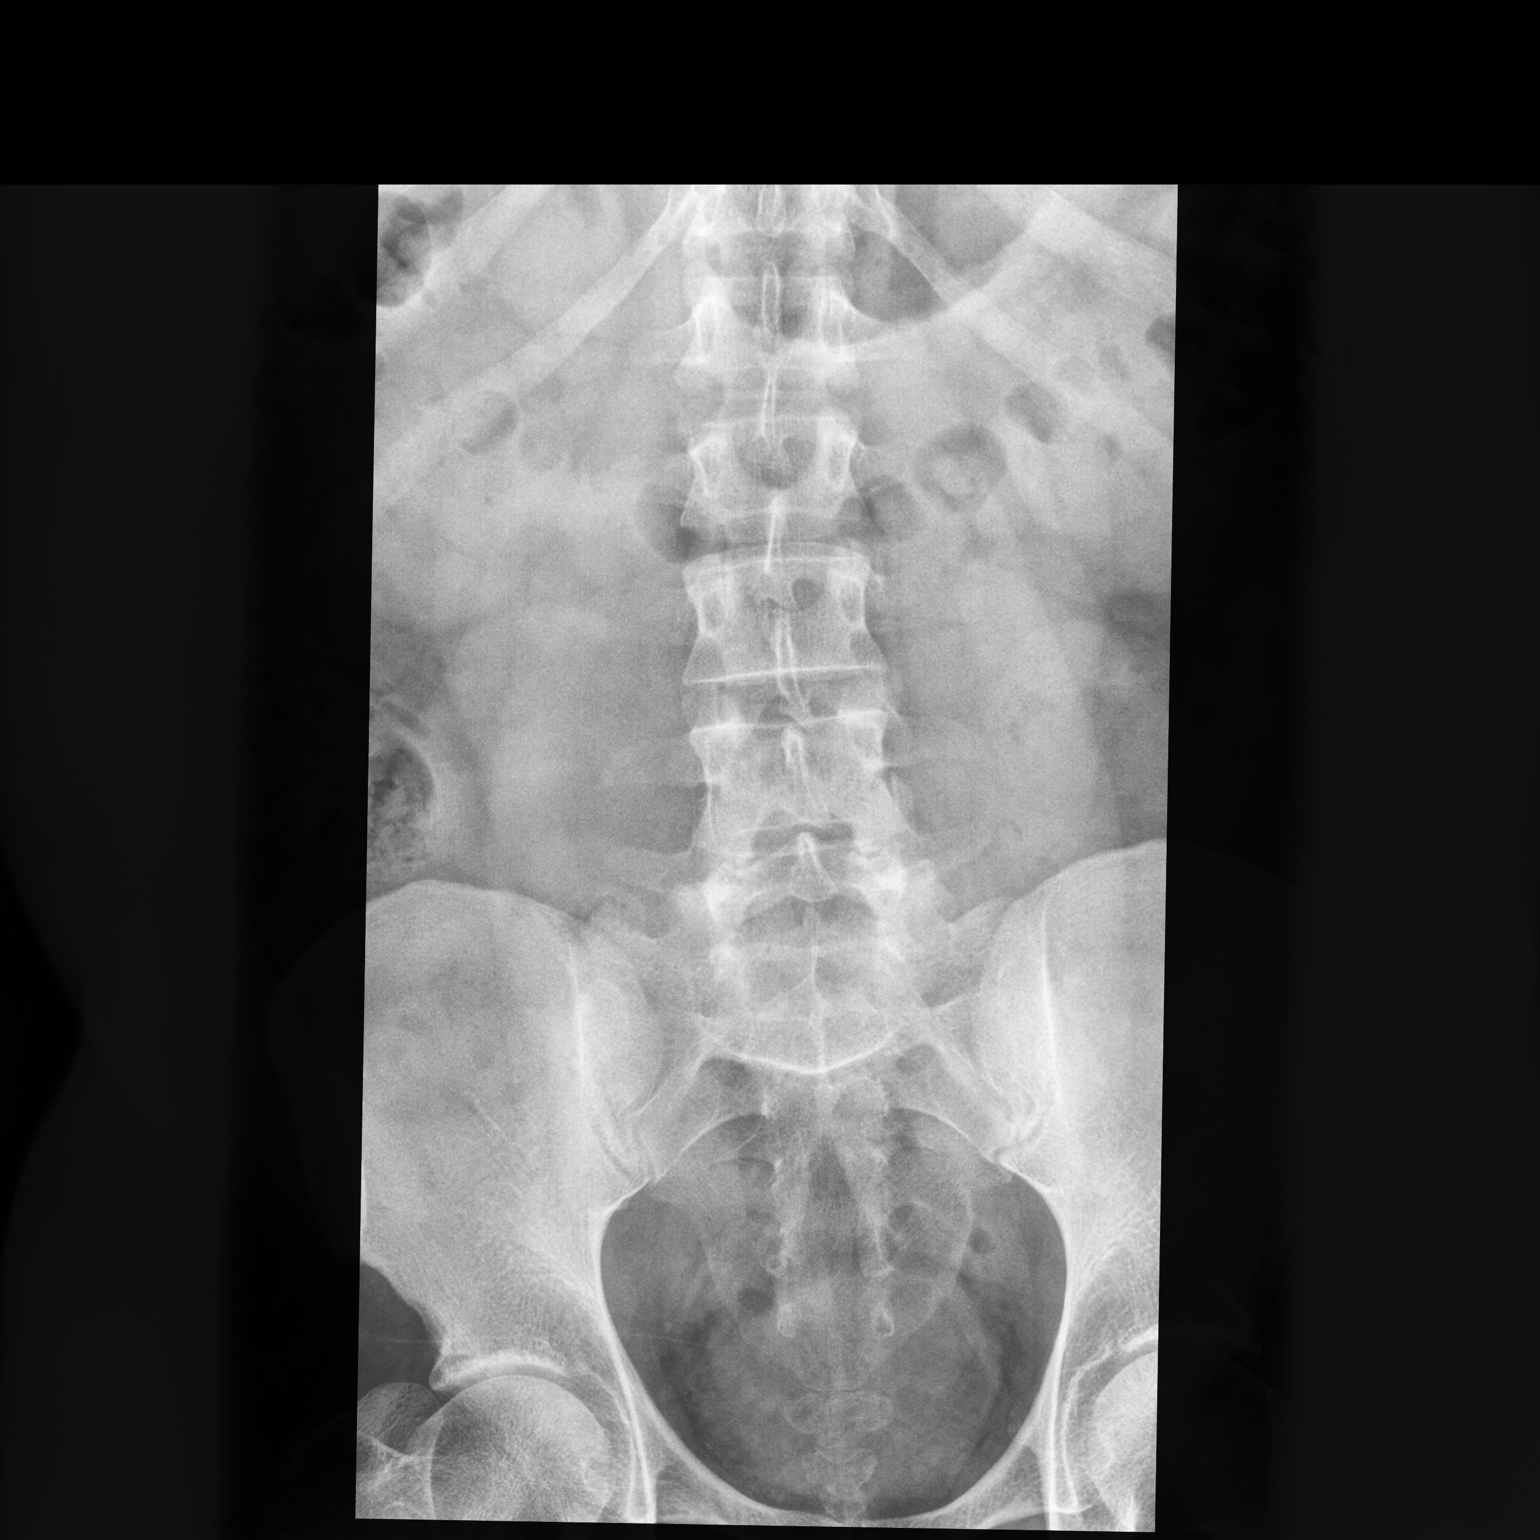

[lumbar spine lat]
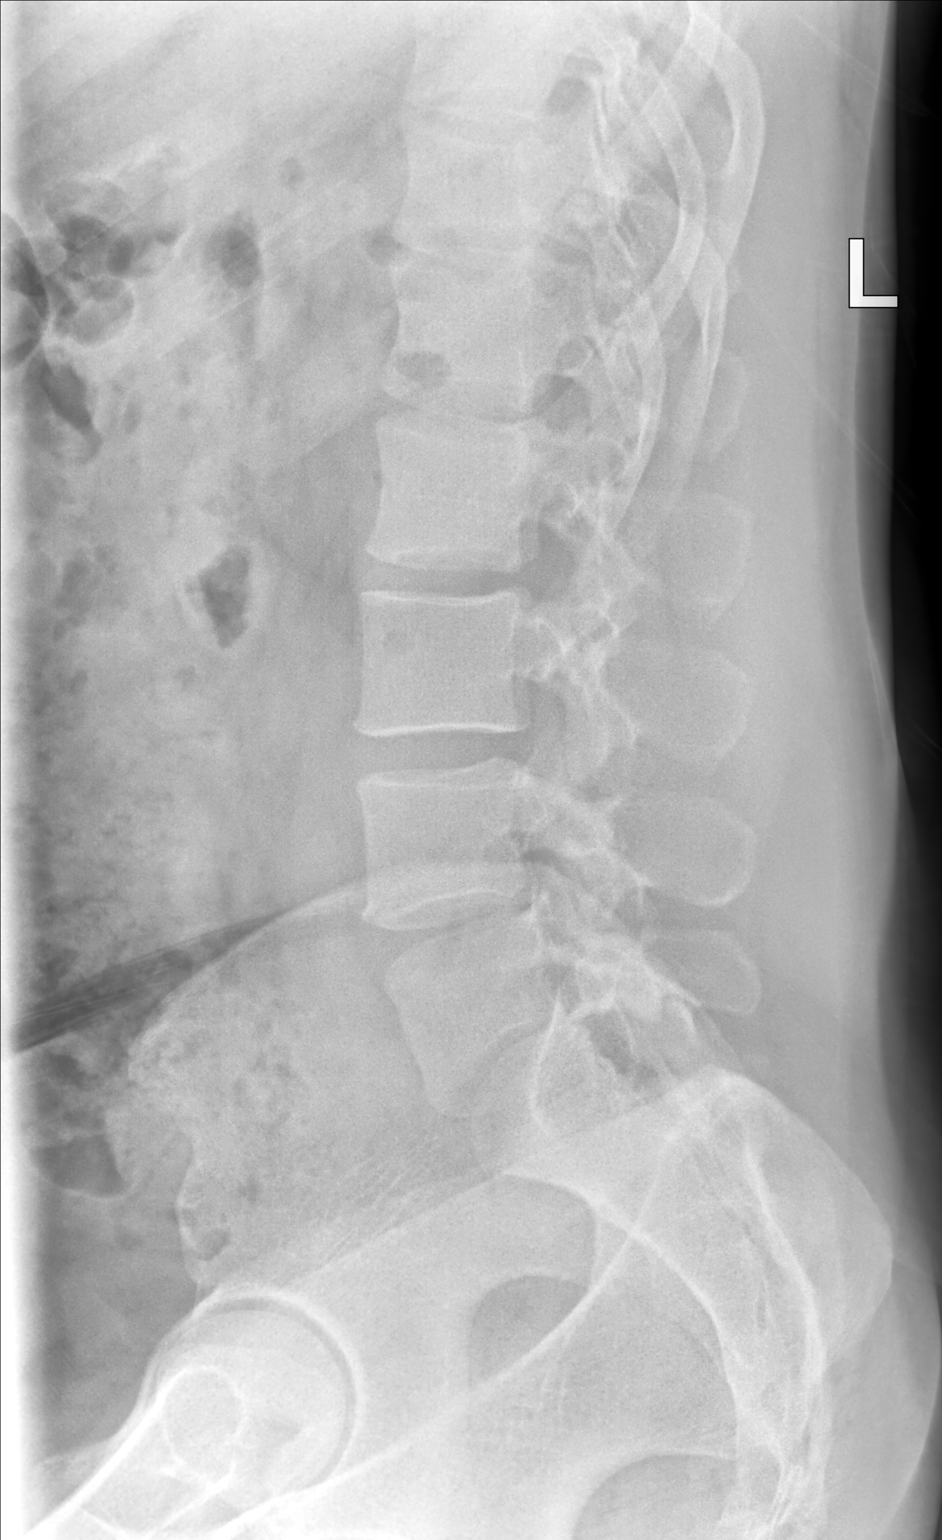

[2 of 2 positions shown; findings below may reference images not displayed]

FINDINGS: There is no evidence of lumbar spine fracture. Alignment is normal.
Intervertebral disc spaces are maintained.
IMPRESSION: Negative.

## 2022-07-25 ENCOUNTER — Ambulatory Visit: Admitting: Primary Care

## 2022-07-25 ENCOUNTER — Encounter: Payer: Self-pay | Admitting: Primary Care

## 2022-07-25 VITALS — BP 122/80 | HR 76 | Temp 99.3°F | Ht 68.0 in | Wt 205.0 lb

## 2022-07-25 DIAGNOSIS — M545 Low back pain, unspecified: Secondary | ICD-10-CM

## 2022-07-25 DIAGNOSIS — G8929 Other chronic pain: Secondary | ICD-10-CM

## 2022-07-25 DIAGNOSIS — K51919 Ulcerative colitis, unspecified with unspecified complications: Secondary | ICD-10-CM | POA: Diagnosis not present

## 2022-07-25 DIAGNOSIS — G4733 Obstructive sleep apnea (adult) (pediatric): Secondary | ICD-10-CM | POA: Diagnosis not present

## 2022-07-25 MED ORDER — CYCLOBENZAPRINE HCL 5 MG PO TABS: 5.0000 mg | ORAL_TABLET | Freq: Three times a day (TID) | ORAL | 0 refills | Status: DC | PRN

## 2022-07-25 NOTE — Progress Notes (Signed)
Subjective:    Patient ID: Craig Rios, male    DOB: 01/24/1981, 41 y.o.   MRN: [id number]  Back Pain Pertinent negatives include no abdominal pain, chest pain or numbness.    Craig Rios is a very pleasant 41 y.o. male with a history of OSA, ulcerative colitis lower extremity edema, chronic low back pain who presents today to discuss back pain and follow up.  1) Chronic Back Pain: History of chronic back pain with intermittent flares. His pain is intermittent, mostly located to the left side. A few weeks ago he developed a flare of his chronic back pain, took the cyclobenzaprine 2 times daily with improvement.   Over the last week he's noticed a discomfort and a "popping sensation" which occurs multiple times daily, especially with certain movements such as twisting and bending forward. He denies pain with this movement.    He denies numbness to his lower extremities. He was referred to PT last year after a flare, but he never received a phone call regarding an appointment.   2) Ulcerative Colitis: Chronic. Currently following with GI and is managed on Lialda 1.2 g four times daily. Symptoms have been well managed, no flares.    Review of Systems  Respiratory:  Negative for shortness of breath.   Cardiovascular:  Negative for chest pain.  Gastrointestinal:  Negative for abdominal pain.  Musculoskeletal:  Positive for back pain.  Neurological:  Negative for numbness.         Past Medical History:  Diagnosis Date   Blood in stool    Ulcerative colitis (HCC)     Social History   Socioeconomic History   Marital status: Single    Spouse name: Not on file   Number of children: Not on file   Years of education: Not on file   Highest education level: Not on file  Occupational History   Not on file  Tobacco Use   Smoking status: Former    Packs/day: 0.50    Years: 15.00    Total pack years: 7.50    Types: Cigarettes   Smokeless tobacco: Never  Vaping Use    Vaping Use: Never used  Substance and Sexual Activity   Alcohol use: Yes   Drug use: No   Sexual activity: Not on file  Other Topics Concern   Not on file  Social History Narrative   Single.   Works for [employer].   2 children.   Social Determinants of Health   Financial Resource Strain: Not on file  Food Insecurity: Not on file  Transportation Needs: Not on file  Physical Activity: Not on file  Stress: Not on file  Social Connections: Not on file  Intimate Partner Violence: Not on file    Past Surgical History:  Procedure Laterality Date   WISDOM TOOTH EXTRACTION      Family History  Problem Relation Age of Onset   Stroke Mother    Heart attack Father     No Known Allergies  Current Outpatient Medications on File Prior to Visit  Medication Sig Dispense Refill   mesalamine (LIALDA) 1.2 G EC tablet Take 1,200 mg by mouth 4 (four) times daily.     triamcinolone ointment (KENALOG) 0.5 % Apply 1 application topically 2 (two) times daily. (Patient not taking: Reported on 07/25/2022) 30 g 0   No current facility-administered medications on file prior to visit.    BP 122/80   Pulse 76   Temp 99.3 F (37.4 C) (  Temporal)   Ht 5\' 8"  (1.727 m)   Wt 205 lb (93 kg)   SpO2 98%   BMI 31.17 kg/m  Objective:   Physical Exam Cardiovascular:     Rate and Rhythm: Normal rate and regular rhythm.  Pulmonary:     Effort: Pulmonary effort is normal.     Breath sounds: Normal breath sounds. No wheezing or rales.  Musculoskeletal:     Cervical back: Neck supple.     Lumbar back: No tenderness or bony tenderness. Normal range of motion. Negative right straight leg raise test and negative left straight leg raise test.  Skin:    General: Skin is warm and dry.  Neurological:     Mental Status: He is alert and oriented to person, place, and time.           Assessment & Plan:   Problem List Items Addressed This Visit       Respiratory   OSA (obstructive sleep apnea)     Compliant to CPAP machine nightly. Following with pulmonology         Digestive   Ulcerative colitis (HCC)    Controlled.  Continue Lialda 1.2 g QID. Follows with GI.        Other   Chronic back pain - Primary    Reviewed lumbar plain films from 2022.  No abnormality on exam today. Referral placed back for physical therapy as he would benefit. I've asked for him to call me if he doesn't hear back within 2 weeks.  Continue cyclobenzaprine 5 mg PRN.      Relevant Medications   cyclobenzaprine (FLEXERIL) 5 MG tablet   Other Relevant Orders   Ambulatory referral to Physical Therapy       2023, NP

## 2022-07-25 NOTE — Assessment & Plan Note (Signed)
Controlled.  Continue Lialda 1.2 g QID. Follows with GI.

## 2022-07-25 NOTE — Assessment & Plan Note (Signed)
Compliant to CPAP machine nightly. Following with pulmonology.  

## 2022-07-25 NOTE — Assessment & Plan Note (Signed)
Reviewed lumbar plain films from 2022.  No abnormality on exam today. Referral placed back for physical therapy as he would benefit. I've asked for him to call me if he doesn't hear back within 2 weeks.  Continue cyclobenzaprine 5 mg PRN.

## 2022-08-12 ENCOUNTER — Encounter: Payer: Self-pay | Admitting: Internal Medicine

## 2022-08-12 ENCOUNTER — Ambulatory Visit: Admitting: Internal Medicine

## 2022-08-12 VITALS — BP 122/88 | HR 103 | Ht 68.0 in | Wt 210.2 lb

## 2022-08-12 DIAGNOSIS — G4733 Obstructive sleep apnea (adult) (pediatric): Secondary | ICD-10-CM | POA: Diagnosis not present

## 2022-08-12 DIAGNOSIS — K51919 Ulcerative colitis, unspecified with unspecified complications: Secondary | ICD-10-CM

## 2022-08-12 NOTE — Assessment & Plan Note (Signed)
CPAP works well and he says he doesn't mind using it, but isn't meeting usage goals.   Plan- educated on compliance goals, discussed alternatives. Will continue CPAP now, but refer to orthodontics to consider oral appliance alternative.

## 2022-08-12 NOTE — Assessment & Plan Note (Signed)
Followed by GI

## 2022-08-12 NOTE — Patient Instructions (Signed)
Your CPAP works very well when you can wear it. Our goal is to wear it any time you sleep.  If stuffy nose is a problem try otc nasal saline spray or Flonase(fluticasone).  Order- referral to Orthodontist Althea Grimmer, DDS to consider an oral appliance for OSA

## 2022-08-12 NOTE — Progress Notes (Signed)
08/12/22- 41 yoM former smoker followed for OSA, complicated by Ulcerative Colitis, Back Pain,  HST 11/04/19- AHI 39.1/ hr , desaturation to 81%, body weight 202 lbs CPAP auto 5-15/ [employer] [facility] Walsh, NP 05/08/21 Covid vax- 3 Phizer Flu vax- had Body weight today 210 lbs Download compliance 37%, AHI 0.8/ hr We discussed CPAP use. He says he doesn't mind using it with full-face mask, but the refers to stuffy nose, kids. I reviewed compliance goals and alternatives to CPAP. He is interested in exploring oral appliance therapy. He mentions exploring a claim against [facility] for his sleep apnea and will request copy of records.  ROS-see HPI  + = positive Constitutional:    weight loss, night sweats, fevers, chills, fatigue, lassitude. HEENT:    headaches, difficulty swallowing, tooth/dental problems, sore throat,       sneezing, itching, ear ache, nasal congestion, post nasal drip, snoring CV:    chest pain, orthopnea, PND, swelling in lower extremities, anasarca,                                  dizziness, palpitations Resp:   shortness of breath with exertion or at rest.                productive cough,   non-productive cough, coughing up of blood.              change in color of mucus.  wheezing.   Skin:    rash or lesions. GI:  No-   heartburn, indigestion, abdominal pain, nausea, vomiting, diarrhea,                 change in bowel habits, loss of appetite GU: dysuria, change in color of urine, no urgency or frequency.   flank pain. MS:   joint pain, stiffness, decreased range of motion, back pain. Neuro-     nothing unusual Psych:  change in mood or affect.  depression or anxiety.   memory loss.  OBJ- Physical Exam General- Alert, Oriented, Affect-appropriate, Distress- none acute Skin- rash-none, lesions- none, excoriation- none Lymphadenopathy- none Head- atraumatic            Eyes- Gross vision intact, PERRLA, conjunctivae and secretions clear            Ears- Hearing, canals-normal             Nose- No obvious congestion,  no-Septal dev, mucus, polyps, erosion, perforation             Throat- Mallampati III-IV , mucosa clear , drainage- none, tonsils- atrophic, +teeth Neck- flexible , trachea midline, no stridor , thyroid nl, carotid no bruit Chest - symmetrical excursion , unlabored           Heart/CV- RRR , no murmur , no gallop  , no rub, nl s1 s2                           - JVD- none , edema- none, stasis changes- none, varices- none           Lung- clear to P&A, wheeze- none, cough- none , dullness-none, rub- none           Chest wall-  Abd-  Br/ Gen/ Rectal- Not done, not indicated Extrem- cyanosis- none, clubbing, none, atrophy- none, strength- nl Neuro- grossly intact to observation

## 2022-09-11 ENCOUNTER — Ambulatory Visit: Attending: Primary Care

## 2022-09-11 DIAGNOSIS — M545 Low back pain, unspecified: Secondary | ICD-10-CM | POA: Insufficient documentation

## 2022-09-11 DIAGNOSIS — G8929 Other chronic pain: Secondary | ICD-10-CM | POA: Insufficient documentation

## 2022-09-11 DIAGNOSIS — M5416 Radiculopathy, lumbar region: Secondary | ICD-10-CM | POA: Diagnosis present

## 2022-09-11 DIAGNOSIS — M5459 Other low back pain: Secondary | ICD-10-CM | POA: Diagnosis not present

## 2022-09-11 NOTE — Therapy (Signed)
[facility] PHYSICAL AND SPORTS MEDICINE [address] [place], [address] Phone: [phone]   Fax:  [phone]  Physical Therapy Evaluation  Patient Details  Name: Craig Rios MRN: [id number] Date of Birth: 1981/06/14 Referring Provider (PT): Doreene Nest, NP   Encounter Date: 09/11/2022   PT End of Session - 09/11/22 0851     Visit Number 1    Number of Visits 17    Date for PT Re-Evaluation 11/07/22    PT Start Time 0851    PT Stop Time 0934    PT Time Calculation (min) 43 min    Activity Tolerance Patient tolerated treatment well    Behavior During Therapy [facility] for tasks assessed/performed             Past Medical History:  Diagnosis Date   Blood in stool    Ulcerative colitis [facility])     Past Surgical History:  Procedure Laterality Date   WISDOM TOOTH EXTRACTION      There were no vitals filed for this visit.    Subjective Assessment - 09/11/22 0854     Subjective Low back (mainly L side and L glute): 5/10 currently (tight)    Pertinent History low back pain. Pain began around 2010. Pt was in the [employer] and lifted a tent. pulled his back. Normally gets muscle relaxers and call it a day. However, his back currently pops and it has never done that before. The popping began early November 2023. Paion is located L lower back. Pain has been the same. Pain shifts from L to R side to his glutes. Denies loss of bowel or bladder control, no LE paresthesia.    Patient Stated Goals Be able to run.    Currently in Pain? Yes    Pain Score 5     Pain Location Back    Pain Orientation Left;Lower    Pain Descriptors / Indicators Tightness    Pain Type Chronic pain    Pain Radiating Towards L glute max    Pain Onset More than a month ago    Pain Frequency Occasional    Aggravating Factors  bending, twisting, prolonged sitting sitting (on an uncomfortable chair); L S/L, prolonged standing (15-20 minutes), standing  and washing dishes; running.    Pain Relieving Factors laying flat on his back, initial sitting after standing, muscle relxers, stretches, seated hip adduction resisted.                [facility] PT Assessment - 09/11/22 0903       Assessment   Medical Diagnosis M54.50,G89.29 (ICD-10-CM) - Chronic bilateral low back pain without sciatica    Referring Provider (PT) Doreene Nest, NP    Onset Date/Surgical Date 07/25/22   date PT referral signed   Prior Therapy No known PT for current condition      Precautions   Precaution Comments no known precautions      Restrictions   Other Position/Activity Restrictions no known restrictions      Posture/Postural Control   Posture Comments Slight R lateral shift, L shoulder slightly higher, B protracted shoulders, movement crease around L2/3      AROM   Lumbar Flexion WFL wiht L low back tighness.    Lumbar Extension WFL with L low back tightness reproduction    Lumbar - Right Side Bend WFL, L low back tightness with overpressure    Lumbar - Left Side Bend [facility]    Lumbar -  Right Rotation full    Lumbar - Left Rotation full      PROM   Right Hip Extension -10    Right Hip Internal Rotation  32    Left Hip Extension -10    Left Hip Internal Rotation  22      Strength   Right Hip Flexion 4-/5    Right Hip Extension 4-/5    Right Hip ABduction 4/5    Left Hip Flexion 4-/5    Left Hip Extension 4/5    Left Hip ABduction 4/5    Right Knee Flexion 5/5    Right Knee Extension 5/5    Left Knee Flexion 4+/5    Left Knee Extension 5/5      Special Tests   Other special tests (-) repeated flexion test. R L4/L5 area popping heard.   (+) Slump L LE with symptoms. (+) Slump R LE.   (+) long sit test suggesting anterior nutation of L innominate                        Objective measurements completed on examination: See above findings.        Decreased L low back tightness with R lateral shift correction.  (-)  repeated flexion test. R L4/L5 area popping heard.  (+) Slump L LE with symptoms. (+) Slump R LE.  (+) long sit test suggesting anterior nutation of L innominate.   L lumbar side bend directional preference    Therapeutic exercise  Standing R lateral shift correction. 10x5 seconds   Reviewed and given as part of his HEP. Pt demonstrated and verbalized understanding. Handout provided.    Improved exercise technique, movement at target joints, use of target muscles after mod verbal, visual, tactile cues.     Response to treatment Pt tolerated session well without aggravation of symptoms.    Clinical impression Pt is a 42 year old male who came to physical therapy secondary to chronic low back pain. He also presents with B posterior hip pain, altered posture, positive special tests suggesting lumbopelvic involvement as well as B LE neural tension; bilateral hip weakness, reproduction of symptoms with lumbar flexion, extension and R side bending AROM, and difficulty performing tasks which involve bending over, twisting, standing, and prolonged sitting secondary to pain. Pt will benefit from skilled physical therapy services to address the aforementioned deficits.         Access Code: [email or web address] URL: [email or web address] Date: 09/11/2022 Prepared by: Joneen Boers  Exercises - Right Standing Lateral Shift Correction at [place]  - 3 x daily - 7 x weekly - 3 sets - 10 reps - 5 seconds hold        PT Education - 09/11/22 1317     Education Details ther-ex, HEP, POC    Person(s) Educated Patient    Methods Explanation;Demonstration;Tactile cues;Verbal cues;Handout    Comprehension Verbalized understanding;Returned demonstration              PT Short Term Goals - 09/11/22 1310       PT SHORT TERM GOAL #1   Title Pt will be independent with his initial HEP to decrease pain, improve strength, and function.    Baseline Pt has started his initial  HEP (09/11/2022)    Time 3    Period Weeks    Status New    Target Date 10/03/22               PT  Long Term Goals - 09/11/22 1310       PT LONG TERM GOAL #1   Title Pt will improve his lumbar spine FOTO score by at least 10 points as a demonstration of improved function.    Baseline Lumbar Spine FOTO 51 (09/11/2022)    Time 8    Period Weeks    Status New    Target Date 11/07/22      PT LONG TERM GOAL #2   Title Pt will improve bilateral hip extension and abduction strength by at least 1/2 MMT to promote ability to perform standing tasks more comfortably.    Baseline Hip extension 4-/5 R, 4/5 L, hip abduction 4/5 R and L (09/11/2022)    Time 8    Period Weeks    Status New    Target Date 11/07/22      PT LONG TERM GOAL #3   Title Pt will have a decrease in low back pain to 3/10 or less at worst to promote ability to perform standing taks, bend over to pick up items, as well as to return to jogging for fitness more comfortably.    Baseline At least 5/10 at worst (5/10 low back pain currently) (09/11/2022)    Time 8    Period Weeks    Status New    Target Date 11/07/22                    Plan - 09/11/22 1259     Clinical Impression Statement Pt is a 42 year old male who came to physical therapy secondary to chronic low back pain. He also presents with B posterior hip pain, altered posture, positive special tests suggesting lumbopelvic involvement as well as B LE neural tension; bilateral hip weakness, reproduction of symptoms with lumbar flexion, extension and R side bending AROM, and difficulty performing tasks which involve bending over, twisting, standing, and prolonged sitting secondary to pain. Pt will benefit from skilled physical therapy services to address the aforementioned deficits.    Personal Factors and Comorbidities Time since onset of injury/illness/exacerbation;Profession    Examination-Activity Limitations Lift;Sit;Stand;Bend    Stability/Clinical  Decision Making Stable/Uncomplicated    Clinical Decision Making Low    Rehab Potential Good    PT Frequency 2x / week    PT Duration 8 weeks    PT Treatment/Interventions Therapeutic activities;Therapeutic exercise;Neuromuscular re-education;Patient/family education;Manual techniques;Dry needling;Spinal Manipulations;Joint Manipulations;Aquatic Therapy;Electrical Stimulation;Iontophoresis 4mg /ml Dexamethasone;Traction    PT Next Visit Plan posture, trunk, hip strengthening, gentle lumbar extension, manual techniques, modalities PRN    PT Home Exercise Plan medbridge  Access Code: [email or web address]    Consulted and Agree with Plan of Care Patient             Patient will benefit from skilled therapeutic intervention in order to improve the following deficits and impairments:  Pain, Postural dysfunction, Improper body mechanics, Decreased strength  Visit Diagnosis: Other low back pain - Plan: PT plan of care cert/re-cert  Lumbar radiculitis - Plan: PT plan of care cert/re-cert     Problem List Patient Active Problem List   Diagnosis Date Noted   Upper respiratory infection, acute 09/07/2021   Chronic back pain 08/17/2021   Rash and nonspecific skin eruption 08/17/2021   Lower extremity edema 03/06/2020   OSA (obstructive sleep apnea) 11/18/2019   Snoring 04/02/2019   Ulcerative colitis ([place]) 04/02/2019      Joneen Boers PT, DPT   09/11/2022, 1:27 PM  [place]  PHYSICAL AND SPORTS MEDICINE [address] [place], [address] Phone: [phone]   Fax:  [phone]  Name: Nyzaiah Kai MRN: [id number] Date of Birth: [birth year]

## 2022-09-16 ENCOUNTER — Ambulatory Visit

## 2022-09-18 ENCOUNTER — Ambulatory Visit

## 2022-09-23 ENCOUNTER — Ambulatory Visit

## 2022-09-24 ENCOUNTER — Ambulatory Visit

## 2022-09-25 ENCOUNTER — Ambulatory Visit

## 2022-09-26 ENCOUNTER — Ambulatory Visit

## 2022-09-26 DIAGNOSIS — M5459 Other low back pain: Secondary | ICD-10-CM | POA: Diagnosis not present

## 2022-09-26 DIAGNOSIS — M5416 Radiculopathy, lumbar region: Secondary | ICD-10-CM

## 2022-09-26 NOTE — Therapy (Signed)
OUTPATIENT PHYSICAL THERAPY TREATMENT NOTE   Patient Name: Craig Rios MRN: [id number] DOB:[birth year], 42 y.o., male Today's Date: 09/26/2022  PCP: Doreene Nest, NP  REFERRING PROVIDER: Doreene Nest, NP   END OF SESSION:  PT End of Session - 09/26/22 1558     Visit Number 2    Number of Visits 17    Date for PT Re-Evaluation 11/07/22    PT Start Time 1559   pt arrived late   PT Stop Time 1627    PT Time Calculation (min) 28 min    Activity Tolerance Patient tolerated treatment well    Behavior During Therapy [facility] for tasks assessed/performed             Past Medical History:  Diagnosis Date   Blood in stool    Ulcerative colitis [facility])    Past Surgical History:  Procedure Laterality Date   WISDOM TOOTH EXTRACTION     Patient Active Problem List   Diagnosis Date Noted   Upper respiratory infection, acute 09/07/2021   Chronic back pain 08/17/2021   Rash and nonspecific skin eruption 08/17/2021   Lower extremity edema 03/06/2020   OSA (obstructive sleep apnea) 11/18/2019   Snoring 04/02/2019   Ulcerative colitis ([facility]) 04/02/2019    REFERRING DIAG: M54.50,G89.29 (ICD-10-CM) - Chronic bilateral low back pain without sciatica   THERAPY DIAG:  Other low back pain  Lumbar radiculitis  Rationale for Evaluation and Treatment Rehabilitation  PERTINENT HISTORY:   low back pain. Pain began around 2010. Pt was in the [employer] and lifted a tent. pulled his back. Normally gets muscle relaxers and call it a day. However, his back currently pops and it has never done that before. The popping began early November 2023. Paion is located L lower back. Pain has been the same. Pain shifts from L to R side to his glutes. Denies loss of bowel or bladder control, no LE paresthesia.    PRECAUTIONS: No known precautions  SUBJECTIVE:    SUBJECTIVE STATEMENT: Low back is the same since last time. Doing the thigh master helps. 3/10 currently  PAIN:  Are you  having pain? See subjective    TODAY'S TREATMENT:                                                                                                                                         DATE: 09/26/2022  Decreased L low back tightness with R lateral shift correction.   (-) repeated flexion test. R L4/L5 area popping heard.  (+) Slump L LE with symptoms. (+) Slump R LE.  (+) long sit test suggesting anterior nutation of L innominate.    L lumbar side bend directional preference       Therapeutic exercise   Standing R lateral shift correction. 10x5 seconds              Reviewed and given as  part of his HEP. Pt demonstrated and verbalized understanding. Handout provided.    R S/L   L hip abduction 10x3   L shoulder abduction 10x5 seconds for 2 sets  Prone glute max/quad set to promote hip extension stretch   R 10x5 second holds for 2 sets  L 10x5 second holds for 2 sets  Standing hip flexor stretch   R 30 seconds x 3  L 30 seconds x 3  Standing L lumbar side bend 10x10 seconds for 3 sets  Decreased back pain reported.        Improved exercise technique, movement at target joints, use of target muscles after mod verbal, visual, tactile cues.        Response to treatment Pt tolerated session well without aggravation of symptoms. Back feels good after session reported.      Clinical impression Pt arrived late so session was adjusted accordingly. Worked on improving posture, hip extension ROM, glute max muscle activation, as well as L side bending to decrease stress to low back. Decreased back pain reported after session. Pt will benefit from continued skilled physical therapy services to decrease pain, improve strength and function.        PATIENT EDUCATION: Education details: there-ex, HEP Person educated: Patient Education method: Explanation, Demonstration, Tactile cues, Verbal cues, and Handouts Education comprehension: verbalized understanding and returned  demonstration  HOME EXERCISE PROGRAM: Access Code: [email or web address] URL: [email or web address] Date: 09/11/2022 Prepared by: Joneen Boers   Exercises - Right Standing Lateral Shift Correction at [place]  - 3 x daily - 7 x weekly - 3 sets - 10 reps - 5 seconds hold - Prone Quadriceps Set  - 1 x daily - 7 x weekly - 3 sets - 10 reps - 5 seconds hold - Standing Sidebends  - 3 x daily - 7 x weekly - 3 sets - 10 reps - 10 seconds hold    PT Short Term Goals - 09/11/22 1310       PT SHORT TERM GOAL #1   Title Pt will be independent with his initial HEP to decrease pain, improve strength, and function.    Baseline Pt has started his initial HEP (09/11/2022)    Time 3    Period Weeks    Status New    Target Date 10/03/22              PT Long Term Goals - 09/11/22 1310       PT LONG TERM GOAL #1   Title Pt will improve his lumbar spine FOTO score by at least 10 points as a demonstration of improved function.    Baseline Lumbar Spine FOTO 51 (09/11/2022)    Time 8    Period Weeks    Status New    Target Date 11/07/22      PT LONG TERM GOAL #2   Title Pt will improve bilateral hip extension and abduction strength by at least 1/2 MMT to promote ability to perform standing tasks more comfortably.    Baseline Hip extension 4-/5 R, 4/5 L, hip abduction 4/5 R and L (09/11/2022)    Time 8    Period Weeks    Status New    Target Date 11/07/22      PT LONG TERM GOAL #3   Title Pt will have a decrease in low back pain to 3/10 or less at worst to promote ability to perform standing taks, bend over to pick up items, as well as to  return to jogging for fitness more comfortably.    Baseline At least 5/10 at worst (5/10 low back pain currently) (09/11/2022)    Time 8    Period Weeks    Status New    Target Date 11/07/22              Plan - 09/26/22 1558     Clinical Impression Statement Pt arrived late so session was adjusted accordingly. Worked on improving posture,  hip extension ROM, glute max muscle activation, as well as L side bending to decrease stress to low back. Decreased back pain reported after session. Pt will benefit from continued skilled physical therapy services to decrease pain, improve strength and function.    Personal Factors and Comorbidities Time since onset of injury/illness/exacerbation;Profession    Examination-Activity Limitations Lift;Sit;Stand;Bend    Stability/Clinical Decision Making Stable/Uncomplicated    Rehab Potential Good    PT Frequency 2x / week    PT Duration 8 weeks    PT Treatment/Interventions Therapeutic activities;Therapeutic exercise;Neuromuscular re-education;Patient/family education;Manual techniques;Dry needling;Spinal Manipulations;Joint Manipulations;Aquatic Therapy;Electrical Stimulation;Iontophoresis 4mg /ml Dexamethasone;Traction    PT Next Visit Plan posture, trunk, hip strengthening, gentle lumbar extension, manual techniques, modalities PRN    PT Home Exercise Plan medbridge  Access Code: [email or web address]    Consulted and Agree with Plan of Care Patient              Joneen Boers PT, DPT  09/26/2022, 4:35 PM

## 2022-09-30 ENCOUNTER — Ambulatory Visit

## 2022-09-30 DIAGNOSIS — M5416 Radiculopathy, lumbar region: Secondary | ICD-10-CM

## 2022-09-30 DIAGNOSIS — M5459 Other low back pain: Secondary | ICD-10-CM | POA: Diagnosis not present

## 2022-09-30 NOTE — Therapy (Signed)
OUTPATIENT PHYSICAL THERAPY TREATMENT NOTE   Patient Name: Craig Rios MRN: [id number] DOB:Apr 24, 1981, 42 y.o., male Today's Date: 09/30/2022  PCP: Doreene Nest, NP  REFERRING PROVIDER: Doreene Nest, NP   END OF SESSION:  PT End of Session - 09/30/22 1547     Visit Number 3    Number of Visits 17    Date for PT Re-Evaluation 11/07/22    PT Start Time 1547    PT Stop Time 1630    PT Time Calculation (min) 43 min    Activity Tolerance Patient tolerated treatment well    Behavior During Therapy [facility] for tasks assessed/performed              Past Medical History:  Diagnosis Date   Blood in stool    Ulcerative colitis [facility])    Past Surgical History:  Procedure Laterality Date   WISDOM TOOTH EXTRACTION     Patient Active Problem List   Diagnosis Date Noted   Upper respiratory infection, acute 09/07/2021   Chronic back pain 08/17/2021   Rash and nonspecific skin eruption 08/17/2021   Lower extremity edema 03/06/2020   OSA (obstructive sleep apnea) 11/18/2019   Snoring 04/02/2019   Ulcerative colitis ([facility]) 04/02/2019    REFERRING DIAG: M54.50,G89.29 (ICD-10-CM) - Chronic bilateral low back pain without sciatica   THERAPY DIAG:  Other low back pain  Lumbar radiculitis  Rationale for Evaluation and Treatment Rehabilitation  PERTINENT HISTORY:   low back pain. Pain began around 2010. Pt was in the [employer] and lifted a tent. pulled his back. Normally gets muscle relaxers and call it a day. However, his back currently pops and it has never done that before. The popping began early November 2023. Paion is located L lower back. Pain has been the same. Pain shifts from L to R side to his glutes. Denies loss of bowel or bladder control, no LE paresthesia.    PRECAUTIONS: No known precautions  SUBJECTIVE:    SUBJECTIVE STATEMENT: Low back is feeling good. Feeling stiff, not pain.    PAIN:  Are you having pain? See subjective    TODAY'S  TREATMENT:                                                                                                                                         DATE: 09/30/2022  Decreased L low back tightness with R lateral shift correction.   (-) repeated flexion test. R L4/L5 area popping heard.  (+) Slump L LE with symptoms. (+) Slump R LE.  (+) long sit test suggesting anterior nutation of L innominate.    L lumbar side bend directional preference   No latex allergies   Therapeutic exercise   Seated manually resisted L lateral shift isometrics in neutral to promote more neutral posture 10x3 with 5 second holds  Standing B shoulder extension with scapular retraction blue band 10x3  Decreased back tightness reported afterwards   Standing L shoulder adduction blue band 10x3  Quadruped hip extension 10x5 seconds each LE  Then alternate hip extension and contralateral shoulder flexion 10x each side  R S/L   L hip abduction 10x3  Prone glute max extension  R 10x  L 10x  Prone glute max/quad set to promote hip extension stretch   R 10x5 second holds   L 10x5 second holds    Standing hip flexor stretch   R 30 seconds x 3  L 30 seconds x 3   Standing L lumbar side bend 10x10 seconds for 2 sets     Improved exercise technique, movement at target joints, use of target muscles after mod verbal, visual, tactile cues.        Response to treatment Pt tolerated session well without aggravation of symptoms.       Clinical impression  Decreasing back pain based on subjective reports. Continued working on improving posture, hip extension ROM, trunk and glute strength as well as L side bending to decrease stress to low back. Pt tolerated session well without aggravation of symptoms. Pt will benefit from continued skilled physical therapy services to decrease pain, improve strength and function.        PATIENT EDUCATION: Education details: there-ex, HEP Person educated:  Patient Education method: Explanation, Demonstration, Tactile cues, Verbal cues, and Handouts Education comprehension: verbalized understanding and returned demonstration  HOME EXERCISE PROGRAM: Access Code: [email or web address] URL: [email or web address] Date: 09/30/2022 Prepared by: Joneen Boers  Exercises - Right Standing Lateral Shift Correction at [place]  - 3 x daily - 7 x weekly - 3 sets - 10 reps - 5 seconds hold - Prone Quadriceps Set  - 1 x daily - 7 x weekly - 3 sets - 10 reps - 5 seconds hold - Standing Hip Flexor Stretch  - 3 x daily - 7 x weekly - 1 sets - 3 reps - 30 seconds hold - Standing Sidebends  - 3 x daily - 7 x weekly - 3 sets - 10 reps - 10 seconds hold - Shoulder extension with resistance - Neutral  - 1 x daily - 7 x weekly - 3 sets - 10 reps     PT Short Term Goals - 09/11/22 1310       PT SHORT TERM GOAL #1   Title Pt will be independent with his initial HEP to decrease pain, improve strength, and function.    Baseline Pt has started his initial HEP (09/11/2022)    Time 3    Period Weeks    Status New    Target Date 10/03/22              PT Long Term Goals - 09/11/22 1310       PT LONG TERM GOAL #1   Title Pt will improve his lumbar spine FOTO score by at least 10 points as a demonstration of improved function.    Baseline Lumbar Spine FOTO 51 (09/11/2022)    Time 8    Period Weeks    Status New    Target Date 11/07/22      PT LONG TERM GOAL #2   Title Pt will improve bilateral hip extension and abduction strength by at least 1/2 MMT to promote ability to perform standing tasks more comfortably.    Baseline Hip extension 4-/5 R, 4/5 L, hip abduction 4/5 R and L (09/11/2022)    Time 8    Period Weeks  Status New    Target Date 11/07/22      PT LONG TERM GOAL #3   Title Pt will have a decrease in low back pain to 3/10 or less at worst to promote ability to perform standing taks, bend over to pick up items, as well as to return  to jogging for fitness more comfortably.    Baseline At least 5/10 at worst (5/10 low back pain currently) (09/11/2022)    Time 8    Period Weeks    Status New    Target Date 11/07/22              Plan - 09/30/22 1547     Clinical Impression Statement Decreasing back pain based on subjective reports. Continued working on improving posture, hip extension ROM, trunk and glute strength as well as L side bending to decrease stress to low back. Pt tolerated session well without aggravation of symptoms. Pt will benefit from continued skilled physical therapy services to decrease pain, improve strength and function.    Personal Factors and Comorbidities Time since onset of injury/illness/exacerbation;Profession    Examination-Activity Limitations Lift;Sit;Stand;Bend    Stability/Clinical Decision Making Stable/Uncomplicated    Clinical Decision Making Low    Rehab Potential Good    PT Frequency 2x / week    PT Duration 8 weeks    PT Treatment/Interventions Therapeutic activities;Therapeutic exercise;Neuromuscular re-education;Patient/family education;Manual techniques;Dry needling;Spinal Manipulations;Joint Manipulations;Aquatic Therapy;Electrical Stimulation;Iontophoresis 4mg /ml Dexamethasone;Traction    PT Next Visit Plan posture, trunk, hip strengthening, gentle lumbar extension, manual techniques, modalities PRN    PT Home Exercise Plan medbridge  Access Code: [email or web address]    Consulted and Agree with Plan of Care Patient              Joneen Boers PT, DPT  09/30/2022, 4:37 PM

## 2022-10-02 ENCOUNTER — Ambulatory Visit

## 2022-10-02 ENCOUNTER — Encounter

## 2022-10-02 DIAGNOSIS — M5459 Other low back pain: Secondary | ICD-10-CM

## 2022-10-02 DIAGNOSIS — M5416 Radiculopathy, lumbar region: Secondary | ICD-10-CM

## 2022-10-02 NOTE — Therapy (Signed)
OUTPATIENT PHYSICAL THERAPY TREATMENT NOTE   Patient Name: Craig Rios MRN: [id number] DOB:Nov 08, 1980, 42 y.o., male Today's Date: 10/02/2022  PCP: Pleas Koch, NP  REFERRING PROVIDER: Pleas Koch, NP   END OF SESSION:  PT End of Session - 10/02/22 1541     Visit Number 4    Number of Visits 17    Date for PT Re-Evaluation 11/07/22    PT Start Time 1542    PT Stop Time 1624    PT Time Calculation (min) 42 min    Activity Tolerance Patient tolerated treatment well    Behavior During Therapy [facility] for tasks assessed/performed               Past Medical History:  Diagnosis Date   Blood in stool    Ulcerative colitis [facility])    Past Surgical History:  Procedure Laterality Date   WISDOM TOOTH EXTRACTION     Patient Active Problem List   Diagnosis Date Noted   Upper respiratory infection, acute 09/07/2021   Chronic back pain 08/17/2021   Rash and nonspecific skin eruption 08/17/2021   Lower extremity edema 03/06/2020   OSA (obstructive sleep apnea) 11/18/2019   Snoring 04/02/2019   Ulcerative colitis ([place]) 04/02/2019    REFERRING DIAG: M54.50,G89.29 (ICD-10-CM) - Chronic bilateral low back pain without sciatica   THERAPY DIAG:  Other low back pain  Lumbar radiculitis  Rationale for Evaluation and Treatment Rehabilitation  PERTINENT HISTORY:   low back pain. Pain began around 2010. Pt was in the [employer] and lifted a tent. pulled his back. Normally gets muscle relaxers and call it a day. However, his back currently pops and it has never done that before. The popping began early November 2023. Paion is located L lower back. Pain has been the same. Pain shifts from L to R side to his glutes. Denies loss of bowel or bladder control, no LE paresthesia.    PRECAUTIONS: No known precautions  SUBJECTIVE:    SUBJECTIVE STATEMENT: Back is feeling good, no pain, just a normal tightness.    PAIN:  Are you having pain? See  subjective    TODAY'S TREATMENT:                                                                                                                                         DATE: 10/02/2022  Decreased L low back tightness with R lateral shift correction.   (-) repeated flexion test. R L4/L5 area popping heard.  (+) Slump L LE with symptoms. (+) Slump R LE.  (+) long sit test suggesting anterior nutation of L innominate.    L lumbar side bend directional preference   No latex allergies   Therapeutic exercise   Seated manually resisted L lateral shift isometrics in neutral to promote more neutral posture 10x3 with 5 second holds  Standing B shoulder extension with scapular retraction blue  band 10x3  Decreased back tightness reported afterwards  Prone on elbows x 2 minutes  Quadruped alternate hip extension and contralateral shoulder flexion 10x2 with 5 second holds each side  Supine hip flexor stretch, leg hanging off table with contralateral LE in hooklying position  L 30 seconds x 3  R 30 seconds x 3   Bridge 10x2  Standing split squat with B UE assist   L 10x3  R 10x3   Standing hip flexor stretch   R 30 seconds x 3  L 30 seconds x 3   Standing L shoulder adduction blue band 10x5 seconds then 10x10 seconds   Standing B shoulder extension at [facility] machine plate 20 for 67H4 seconds      Improved exercise technique, movement at target joints, use of target muscles after mod verbal, visual, tactile cues.        Response to treatment Pt tolerated session well without aggravation of symptoms. No back pain and decreased back tightness reported after session.       Clinical impression  Pt continues to demonstrate no low back pain today. Continued working on improving posture, hip extension ROM, trunk and glute strength to decrease stress to low back. Pt tolerated session well without aggravation of symptoms. Pt will benefit from continued skilled physical therapy  services to decrease pain, improve strength and function.        PATIENT EDUCATION: Education details: there-ex, HEP Person educated: Patient Education method: Explanation, Demonstration, Tactile cues, Verbal cues, and Handouts Education comprehension: verbalized understanding and returned demonstration  HOME EXERCISE PROGRAM: Access Code: [email or web address] URL: [email or web address] Date: 09/30/2022 Prepared by: Joneen Boers  Exercises - Right Standing Lateral Shift Correction at [place]  - 3 x daily - 7 x weekly - 3 sets - 10 reps - 5 seconds hold - Prone Quadriceps Set  - 1 x daily - 7 x weekly - 3 sets - 10 reps - 5 seconds hold - Standing Hip Flexor Stretch  - 3 x daily - 7 x weekly - 1 sets - 3 reps - 30 seconds hold - Standing Sidebends  - 3 x daily - 7 x weekly - 3 sets - 10 reps - 10 seconds hold - Shoulder extension with resistance - Neutral  - 1 x daily - 7 x weekly - 3 sets - 10 reps     PT Short Term Goals - 09/11/22 1310       PT SHORT TERM GOAL #1   Title Pt will be independent with his initial HEP to decrease pain, improve strength, and function.    Baseline Pt has started his initial HEP (09/11/2022)    Time 3    Period Weeks    Status New    Target Date 10/03/22              PT Long Term Goals - 09/11/22 1310       PT LONG TERM GOAL #1   Title Pt will improve his lumbar spine FOTO score by at least 10 points as a demonstration of improved function.    Baseline Lumbar Spine FOTO 51 (09/11/2022)    Time 8    Period Weeks    Status New    Target Date 11/07/22      PT LONG TERM GOAL #2   Title Pt will improve bilateral hip extension and abduction strength by at least 1/2 MMT to promote ability to perform standing tasks more comfortably.    Baseline Hip  extension 4-/5 R, 4/5 L, hip abduction 4/5 R and L (09/11/2022)    Time 8    Period Weeks    Status New    Target Date 11/07/22      PT LONG TERM GOAL #3   Title Pt will have a  decrease in low back pain to 3/10 or less at worst to promote ability to perform standing taks, bend over to pick up items, as well as to return to jogging for fitness more comfortably.    Baseline At least 5/10 at worst (5/10 low back pain currently) (09/11/2022)    Time 8    Period Weeks    Status New    Target Date 11/07/22              Plan - 10/02/22 1540     Clinical Impression Statement Pt continues to demonstrate no low back pain today. Continued working on improving posture, hip extension ROM, trunk and glute strength to decrease stress to low back. Pt tolerated session well without aggravation of symptoms. Pt will benefit from continued skilled physical therapy services to decrease pain, improve strength and function.    Personal Factors and Comorbidities Time since onset of injury/illness/exacerbation;Profession    Examination-Activity Limitations Lift;Sit;Stand;Bend    Stability/Clinical Decision Making Stable/Uncomplicated    Clinical Decision Making Low    Rehab Potential Good    PT Frequency 2x / week    PT Duration 8 weeks    PT Treatment/Interventions Therapeutic activities;Therapeutic exercise;Neuromuscular re-education;Patient/family education;Manual techniques;Dry needling;Spinal Manipulations;Joint Manipulations;Aquatic Therapy;Electrical Stimulation;Iontophoresis 4mg /ml Dexamethasone;Traction    PT Next Visit Plan posture, trunk, hip strengthening, gentle lumbar extension, manual techniques, modalities PRN    PT Home Exercise Plan [email or web address]  Access Code:    Consulted and Agree with Plan of Care Patient              [email or web address] PT, DPT  10/02/2022, 4:36 PM

## 2022-10-07 ENCOUNTER — Encounter

## 2022-10-09 ENCOUNTER — Ambulatory Visit

## 2022-10-09 ENCOUNTER — Encounter

## 2022-10-09 DIAGNOSIS — M5459 Other low back pain: Secondary | ICD-10-CM

## 2022-10-09 DIAGNOSIS — M5416 Radiculopathy, lumbar region: Secondary | ICD-10-CM

## 2022-10-09 NOTE — Therapy (Signed)
OUTPATIENT PHYSICAL THERAPY TREATMENT NOTE   Patient Name: Craig Rios MRN: [id number] DOB:11/18/1980, 42 y.o., male Today's Date: 10/09/2022  PCP: Pleas Koch, NP  REFERRING PROVIDER: Pleas Koch, NP   END OF SESSION:  PT End of Session - 10/09/22 1548     Visit Number 5    Number of Visits 17    Date for PT Re-Evaluation 11/07/22    PT Start Time 1548    PT Stop Time 1629    PT Time Calculation (min) 41 min    Activity Tolerance Patient tolerated treatment well    Behavior During Therapy [facility] for tasks assessed/performed                Past Medical History:  Diagnosis Date   Blood in stool    Ulcerative colitis [facility])    Past Surgical History:  Procedure Laterality Date   WISDOM TOOTH EXTRACTION     Patient Active Problem List   Diagnosis Date Noted   Upper respiratory infection, acute 09/07/2021   Chronic back pain 08/17/2021   Rash and nonspecific skin eruption 08/17/2021   Lower extremity edema 03/06/2020   OSA (obstructive sleep apnea) 11/18/2019   Snoring 04/02/2019   Ulcerative colitis ([place]) 04/02/2019    REFERRING DIAG: M54.50,G89.29 (ICD-10-CM) - Chronic bilateral low back pain without sciatica   THERAPY DIAG:  Other low back pain  Lumbar radiculitis  Rationale for Evaluation and Treatment Rehabilitation  PERTINENT HISTORY:   low back pain. Pain began around 2010. Pt was in the [employer] and lifted a tent. pulled his back. Normally gets muscle relaxers and call it a day. However, his back currently pops and it has never done that before. The popping began early November 2023. Paion is located L lower back. Pain has been the same. Pain shifts from L to R side to his glutes. Denies loss of bowel or bladder control, no LE paresthesia.    PRECAUTIONS: No known precautions  SUBJECTIVE:    SUBJECTIVE STATEMENT: Back is doing real good, no pain, just a normal stiffness. Has not been taking his muscle relaxers. 5/10 low back  stiffness.  Back feels better overall since first starting PT. Bending is fine, not really have a problem squatting down. Standing about 10+ minutes causes his low back to feel stiff.      PAIN:  Are you having pain? See subjective    TODAY'S TREATMENT:                                                                                                                                         DATE: 10/09/2022  Decreased L low back tightness with R lateral shift correction.   (-) repeated flexion test. R L4/L5 area popping heard.  (+) Slump L LE with symptoms. (+) Slump R LE.  (+) long sit test suggesting anterior nutation of L innominate.  L lumbar side bend directional preference   No latex allergies   Therapeutic exercise   Prone on elbows x 2 minutes  Supine hip flexor stretch, leg hanging off table with contralateral LE in hooklying position  L 30 seconds x 3  R 30 seconds x 3   Prone press-ups 10x5 seconds for 3 sets  Standing split squat with B UE assist   L 10x3  R 10x3  Seated manually resisted L lateral shift isometrics in neutral to promote more neutral posture 10x3 with 5 second holds  Forward walk with double dark blue theraband around chest area (for isometric abdominal contraction) 5x10 seconds for 2 sets   Improved exercise technique, movement at target joints, use of target muscles after mod verbal, visual, tactile cues.        Response to treatment Pt tolerated session well without aggravation of symptoms. Back feels better after session reported      Clinical impression  Pt continues to report no starting pain levels for the past few sessions, just stiffness. Pt also currently not taking muscle relaxers for his back. Decreasing overall pain levels based on aforementioned information and pt reports of his back feeling better. Continued working on improving posture, hip extension ROM, trunk and glute strength to decrease stress to low back. Pt  tolerated session well without aggravation of symptoms. Pt will benefit from continued skilled physical therapy services to decrease pain, improve strength and function.        PATIENT EDUCATION: Education details: there-ex, HEP Person educated: Patient Education method: Explanation, Demonstration, Tactile cues, Verbal cues, and Handouts Education comprehension: verbalized understanding and returned demonstration  HOME EXERCISE PROGRAM: Access Code: [email or web address] URL: [email or web address] Date: 09/30/2022 Prepared by: Joneen Boers  Exercises - Right Standing Lateral Shift Correction at [place]  - 3 x daily - 7 x weekly - 3 sets - 10 reps - 5 seconds hold - Prone Quadriceps Set  - 1 x daily - 7 x weekly - 3 sets - 10 reps - 5 seconds hold - Standing Hip Flexor Stretch  - 3 x daily - 7 x weekly - 1 sets - 3 reps - 30 seconds hold - Standing Sidebends  - 3 x daily - 7 x weekly - 3 sets - 10 reps - 10 seconds hold - Shoulder extension with resistance - Neutral  - 1 x daily - 7 x weekly - 3 sets - 10 reps - Hip Flexor Stretch at Edge of Bed  - 3 x daily - 7 x weekly - 1 sets - 3 reps - 30 seconds to 2 minutes  hold - Prone on Elbows Stretch  - 2 x daily - 7 x weekly - 1 sets - 2 reps - 2 minutes hold - Prone Press Up  - 1 x daily - 7 x weekly - 3 sets - 10 reps - 5 seconds  hold   PT Short Term Goals - 09/11/22 1310       PT SHORT TERM GOAL #1   Title Pt will be independent with his initial HEP to decrease pain, improve strength, and function.    Baseline Pt has started his initial HEP (09/11/2022)    Time 3    Period Weeks    Status New    Target Date 10/03/22              PT Long Term Goals - 09/11/22 1310       PT LONG TERM GOAL #1  Title Pt will improve his lumbar spine FOTO score by at least 10 points as a demonstration of improved function.    Baseline Lumbar Spine FOTO 51 (09/11/2022)    Time 8    Period Weeks    Status New    Target Date  11/07/22      PT LONG TERM GOAL #2   Title Pt will improve bilateral hip extension and abduction strength by at least 1/2 MMT to promote ability to perform standing tasks more comfortably.    Baseline Hip extension 4-/5 R, 4/5 L, hip abduction 4/5 R and L (09/11/2022)    Time 8    Period Weeks    Status New    Target Date 11/07/22      PT LONG TERM GOAL #3   Title Pt will have a decrease in low back pain to 3/10 or less at worst to promote ability to perform standing taks, bend over to pick up items, as well as to return to jogging for fitness more comfortably.    Baseline At least 5/10 at worst (5/10 low back pain currently) (09/11/2022)    Time 8    Period Weeks    Status New    Target Date 11/07/22              Plan - 10/09/22 1547     Clinical Impression Statement Pt continues to report no starting pain levels for the past few sessions, just stiffness. Pt also currently not taking muscle relaxers for his back. Decreasing overall pain levels based on aforementioned information and pt reports of his back feeling better. Continued working on improving posture, hip extension ROM, trunk and glute strength to decrease stress to low back. Pt tolerated session well without aggravation of symptoms. Pt will benefit from continued skilled physical therapy services to decrease pain, improve strength and function.    Personal Factors and Comorbidities Time since onset of injury/illness/exacerbation;Profession    Examination-Activity Limitations Lift;Sit;Stand;Bend    Stability/Clinical Decision Making Stable/Uncomplicated    Clinical Decision Making Low    Rehab Potential Good    PT Frequency 2x / week    PT Duration 8 weeks    PT Treatment/Interventions Therapeutic activities;Therapeutic exercise;Neuromuscular re-education;Patient/family education;Manual techniques;Dry needling;Spinal Manipulations;Joint Manipulations;Aquatic Therapy;Electrical Stimulation;Iontophoresis 4mg /ml  Dexamethasone;Traction    PT Next Visit Plan posture, trunk, hip strengthening, gentle lumbar extension, manual techniques, modalities PRN    PT Home Exercise Plan medbridge  Access Code: [email or web address]    Consulted and Agree with Plan of Care Patient              Joneen Boers PT, DPT  10/09/2022, 4:39 PM

## 2022-10-14 ENCOUNTER — Ambulatory Visit: Attending: Primary Care

## 2022-10-14 DIAGNOSIS — M5416 Radiculopathy, lumbar region: Secondary | ICD-10-CM | POA: Insufficient documentation

## 2022-10-14 DIAGNOSIS — M5459 Other low back pain: Secondary | ICD-10-CM | POA: Insufficient documentation

## 2022-10-14 NOTE — Therapy (Signed)
OUTPATIENT PHYSICAL THERAPY TREATMENT NOTE   Patient Name: Craig Rios MRN: [id number] DOB:May 07, 1981, 42 y.o., male Today's Date: 10/14/2022  PCP: Pleas Koch, NP  REFERRING PROVIDER: Pleas Koch, NP   END OF SESSION:  PT End of Session - 10/14/22 1709     Visit Number 6    Number of Visits 17    Date for PT Re-Evaluation 11/07/22    PT Start Time 1709    PT Stop Time 1750    PT Time Calculation (min) 41 min    Activity Tolerance Patient tolerated treatment well    Behavior During Therapy [facility] for tasks assessed/performed                 Past Medical History:  Diagnosis Date   Blood in stool    Ulcerative colitis [facility])    Past Surgical History:  Procedure Laterality Date   WISDOM TOOTH EXTRACTION     Patient Active Problem List   Diagnosis Date Noted   Upper respiratory infection, acute 09/07/2021   Chronic back pain 08/17/2021   Rash and nonspecific skin eruption 08/17/2021   Lower extremity edema 03/06/2020   OSA (obstructive sleep apnea) 11/18/2019   Snoring 04/02/2019   Ulcerative colitis ([place]) 04/02/2019    REFERRING DIAG: M54.50,G89.29 (ICD-10-CM) - Chronic bilateral low back pain without sciatica   THERAPY DIAG:  Other low back pain  Lumbar radiculitis  Rationale for Evaluation and Treatment Rehabilitation  PERTINENT HISTORY:   low back pain. Pain began around 2010. Pt was in the [employer] and lifted a tent. pulled his back. Normally gets muscle relaxers and call it a day. However, his back currently pops and it has never done that before. The popping began early November 2023. Paion is located L lower back. Pain has been the same. Pain shifts from L to R side to his glutes. Denies loss of bowel or bladder control, no LE paresthesia.    PRECAUTIONS: No known precautions  SUBJECTIVE:    SUBJECTIVE STATEMENT: No pain, normal back stiffness.       PAIN:  Are you having pain? See subjective    TODAY'S TREATMENT:                                                                                                                                          DATE: 10/14/2022  Decreased L low back tightness with R lateral shift correction.   (-) repeated flexion test. R L4/L5 area popping heard.  (+) Slump L LE with symptoms. (+) Slump R LE.  (+) long sit test suggesting anterior nutation of L innominate.    L lumbar side bend directional preference   No latex allergies   Therapeutic exercise   Seated manually resisted L lateral shift isometrics in neutral to promote more neutral posture 10x2 with 10 second holds  L side planks 10 seconds x 3  Supine hip flexor stretch, leg hanging off table with contralateral LE in hooklying position  L 30 seconds x 3  R 30 seconds x 3   Supine lower trunk rotation 10x5 seconds each side   Prone press-ups 10x5 seconds for 2 sets  Planks regular 30 seconds x 2   Side stepping with blue thera tube around ankles  32 ft to the R and 32 ft to the L for 2 sets  Standing split squat with B UE assist   L 10x3  R 10x3     Improved exercise technique, movement at target joints, use of target muscles after mod verbal, visual, tactile cues.        Response to treatment Pt tolerated session well without aggravation of symptoms.   Clinical impression  Improved function based on improved FOTO score. Continued working on improving posture, hip extension ROM, trunk and glute strength to decrease stress to low back. Pt tolerated session well without aggravation of symptoms. Pt will benefit from continued skilled physical therapy services to decrease pain, improve strength and function.        PATIENT EDUCATION: Education details: there-ex, HEP Person educated: Patient Education method: Explanation, Demonstration, Tactile cues, Verbal cues, and Handouts Education comprehension: verbalized understanding and returned demonstration  HOME EXERCISE PROGRAM: Access  Code: [email or web address] URL: [email or web address] Date: 09/30/2022 Prepared by: Joneen Boers  Exercises - Right Standing Lateral Shift Correction at [place]  - 3 x daily - 7 x weekly - 3 sets - 10 reps - 5 seconds hold - Prone Quadriceps Set  - 1 x daily - 7 x weekly - 3 sets - 10 reps - 5 seconds hold - Standing Hip Flexor Stretch  - 3 x daily - 7 x weekly - 1 sets - 3 reps - 30 seconds hold - Standing Sidebends  - 3 x daily - 7 x weekly - 3 sets - 10 reps - 10 seconds hold - Shoulder extension with resistance - Neutral  - 1 x daily - 7 x weekly - 3 sets - 10 reps - Hip Flexor Stretch at Edge of Bed  - 3 x daily - 7 x weekly - 1 sets - 3 reps - 30 seconds to 2 minutes  hold - Prone on Elbows Stretch  - 2 x daily - 7 x weekly - 1 sets - 2 reps - 2 minutes hold - Prone Press Up  - 1 x daily - 7 x weekly - 3 sets - 10 reps - 5 seconds  hold - Side Stepping with Resistance at Ankles  - 2 x daily - 7 x weekly - 1 sets - 1 reps lue theratube    PT Long Term Goals - 10/14/22 1754       PT LONG TERM GOAL #1   Title Pt will improve his lumbar spine FOTO score by at least 10 points as a demonstration of improved function.    Baseline Lumbar Spine FOTO 51 (09/11/2022); 65 (10/14/2022)    Time 8    Period Weeks    Status New    Target Date 11/07/22      PT LONG TERM GOAL #2   Title Pt will improve bilateral hip extension and abduction strength by at least 1/2 MMT to promote ability to perform standing tasks more comfortably.    Baseline Hip extension 4-/5 R, 4/5 L, hip abduction 4/5 R and L (09/11/2022)    Time 8    Period Weeks  Status On-going    Target Date 11/07/22      PT LONG TERM GOAL #3   Title Pt will have a decrease in low back pain to 3/10 or less at worst to promote ability to perform standing taks, bend over to pick up items, as well as to return to jogging for fitness more comfortably.    Baseline At least 5/10 at worst (5/10 low back pain currently) (09/11/2022)     Time 8    Period Weeks    Status On-going    Target Date 11/07/22              Plan - 10/14/22 1704     Clinical Impression Statement Improved function based on improved FOTO score. Continued working on improving posture, hip extension ROM, trunk and glute strength to decrease stress to low back. Pt tolerated session well without aggravation of symptoms. Pt will benefit from continued skilled physical therapy services to decrease pain, improve strength and function.    Personal Factors and Comorbidities Time since onset of injury/illness/exacerbation;Profession    Examination-Activity Limitations Lift;Sit;Stand;Bend    Stability/Clinical Decision Making Stable/Uncomplicated    Clinical Decision Making Low    Rehab Potential Good    PT Frequency 2x / week    PT Duration 8 weeks    PT Treatment/Interventions Therapeutic activities;Therapeutic exercise;Neuromuscular re-education;Patient/family education;Manual techniques;Dry needling;Spinal Manipulations;Joint Manipulations;Aquatic Therapy;Electrical Stimulation;Iontophoresis 4mg /ml Dexamethasone;Traction    PT Next Visit Plan posture, trunk, hip strengthening, gentle lumbar extension, manual techniques, modalities PRN    PT Home Exercise Plan medbridge  Access Code: [email or web address]    Consulted and Agree with Plan of Care Patient              Joneen Boers PT, DPT  10/14/2022, 5:58 PM

## 2022-10-16 ENCOUNTER — Ambulatory Visit

## 2022-10-21 ENCOUNTER — Ambulatory Visit

## 2022-10-21 DIAGNOSIS — M5459 Other low back pain: Secondary | ICD-10-CM | POA: Diagnosis not present

## 2022-10-21 DIAGNOSIS — M5416 Radiculopathy, lumbar region: Secondary | ICD-10-CM

## 2022-10-21 NOTE — Therapy (Signed)
OUTPATIENT PHYSICAL THERAPY TREATMENT NOTE   Patient Name: Craig Rios MRN: [id number] DOB:[birth year], 42 y.o., male Today's Date: 10/21/2022  PCP: Pleas Koch, NP  REFERRING PROVIDER: Pleas Koch, NP   END OF SESSION:  PT End of Session - 10/21/22 1709     Visit Number 7    Number of Visits 17    Date for PT Re-Evaluation 11/07/22    PT Start Time 1710    PT Stop Time 1752    PT Time Calculation (min) 42 min    Activity Tolerance Patient tolerated treatment well    Behavior During Therapy [facility] for tasks assessed/performed                  Past Medical History:  Diagnosis Date   Blood in stool    Ulcerative colitis [facility])    Past Surgical History:  Procedure Laterality Date   WISDOM TOOTH EXTRACTION     Patient Active Problem List   Diagnosis Date Noted   Upper respiratory infection, acute 09/07/2021   Chronic back pain 08/17/2021   Rash and nonspecific skin eruption 08/17/2021   Lower extremity edema 03/06/2020   OSA (obstructive sleep apnea) 11/18/2019   Snoring 04/02/2019   Ulcerative colitis ([place]) 04/02/2019    REFERRING DIAG: M54.50,G89.29 (ICD-10-CM) - Chronic bilateral low back pain without sciatica   THERAPY DIAG:  Other low back pain  Lumbar radiculitis  Rationale for Evaluation and Treatment Rehabilitation  PERTINENT HISTORY:   low back pain. Pain began around 2010. Pt was in the [employer] and lifted a tent. pulled his back. Normally gets muscle relaxers and call it a day. However, his back currently pops and it has never done that before. The popping began early November 2023. Pain is located L lower back. Pain has been the same. Pain shifts from L to R side to his glutes. Denies loss of bowel or bladder control, no LE paresthesia.    PRECAUTIONS: No known precautions  SUBJECTIVE:    SUBJECTIVE STATEMENT: Low  back is feeling good, just the normal tightness.  10/10 low back pain at worst before starting PT treatment  in January 2024 which led to him getting muscle relaxers.     PAIN:  Are you having pain? See subjective    TODAY'S TREATMENT:                                                                                                                                         DATE: 10/21/2022  Decreased L low back tightness with R lateral shift correction.   (-) repeated flexion test. R L4/L5 area popping heard.  (+) Slump L LE with symptoms. (+) Slump R LE.  (+) long sit test suggesting anterior nutation of L innominate.    L lumbar side bend directional preference   No latex allergies   Therapeutic exercise    Standing L  side bend 10x10 seconds   Standing trunk extension to the R 10x5 seconds for 3 sets  Decreased low back tightness after aforementioned exercises  Suitcase carry with 40 lb kettle bell on R side 300 ft  Dead lifts with 40 lbs kettle bell 10x3  L side planks 15 seconds x 2  Planks regular 30 seconds x 3  Prone press-ups 10x5 seconds for 2 sets  Prone glute max extension   R 5x5 seconds for 2 sets  L 5x5 seconds for 2 sets  Supine lower trunk rotation 10x5 seconds each side       Improved exercise technique, movement at target joints, use of target muscles after mod verbal, visual, tactile cues.        Response to treatment Pt tolerated session well without aggravation of symptoms.   Clinical impression  Continued working on decreasing R lumbar lateral shift, performing directional preference movements, improving trunk, and glute strength to decrease stress to affected areas. Decreased lumbar tightness reported after treatment. Pt tolerated session well without aggravation of symptoms. Pt will benefit from continued skilled physical therapy services to decrease pain, improve strength and function.        PATIENT EDUCATION: Education details: there-ex, HEP Person educated: Patient Education method: Explanation, Demonstration, Tactile cues,  Verbal cues, and Handouts Education comprehension: verbalized understanding and returned demonstration  HOME EXERCISE PROGRAM: Access Code: [email or web address] URL: [email or web address] Date: 09/30/2022 Prepared by: Joneen Boers  Exercises - Right Standing Lateral Shift Correction at [place]  - 3 x daily - 7 x weekly - 3 sets - 10 reps - 5 seconds hold - Prone Quadriceps Set  - 1 x daily - 7 x weekly - 3 sets - 10 reps - 5 seconds hold - Standing Hip Flexor Stretch  - 3 x daily - 7 x weekly - 1 sets - 3 reps - 30 seconds hold - Standing Sidebends  - 3 x daily - 7 x weekly - 3 sets - 10 reps - 10 seconds hold - Shoulder extension with resistance - Neutral  - 1 x daily - 7 x weekly - 3 sets - 10 reps - Hip Flexor Stretch at Edge of Bed  - 3 x daily - 7 x weekly - 1 sets - 3 reps - 30 seconds to 2 minutes  hold - Prone on Elbows Stretch  - 2 x daily - 7 x weekly - 1 sets - 2 reps - 2 minutes hold - Prone Press Up  - 1 x daily - 7 x weekly - 3 sets - 10 reps - 5 seconds  hold - Side Stepping with Resistance at Ankles  - 2 x daily - 7 x weekly - 1 sets - 1 reps lue theratube - Standing Lumbar Extension  - 2-3 x daily - 7 x weekly - 3 sets - 10 reps - 5 seconds hold   PT Long Term Goals - 10/21/22 1756       PT LONG TERM GOAL #1   Title Pt will improve his lumbar spine FOTO score by at least 10 points as a demonstration of improved function.    Baseline Lumbar Spine FOTO 51 (09/11/2022); 65 (10/14/2022)    Time 8    Period Weeks    Status Achieved    Target Date 11/07/22      PT LONG TERM GOAL #2   Title Pt will improve bilateral hip extension and abduction strength by at least 1/2 MMT to promote ability to  perform standing tasks more comfortably.    Baseline Hip extension 4-/5 R, 4/5 L, hip abduction 4/5 R and L (09/11/2022)    Time 8    Period Weeks    Status On-going    Target Date 11/07/22      PT LONG TERM GOAL #3   Title Pt will have a decrease in low back pain to  3/10 or less at worst to promote ability to perform standing taks, bend over to pick up items, as well as to return to jogging for fitness more comfortably.    Baseline At least 5/10 at worst (5/10 low back pain currently) (09/11/2022); Pt states back pain was 10/10 at worst prior to starting PT on 09/11/2022 (10/21/2022).    Time 8    Period Weeks    Status On-going    Target Date 11/07/22              Plan - 10/21/22 1703     Clinical Impression Statement Continued working on decreasing R lumbar lateral shift, performing directional preference movements, improving trunk, and glute strength to decrease stress to affected areas. Decreased lumbar tightness reported after treatment. Pt tolerated session well without aggravation of symptoms. Pt will benefit from continued skilled physical therapy services to decrease pain, improve strength and function.    Personal Factors and Comorbidities Time since onset of injury/illness/exacerbation;Profession    Examination-Activity Limitations Lift;Sit;Stand;Bend    Stability/Clinical Decision Making Stable/Uncomplicated    Rehab Potential Good    PT Frequency 2x / week    PT Duration 8 weeks    PT Treatment/Interventions Therapeutic activities;Therapeutic exercise;Neuromuscular re-education;Patient/family education;Manual techniques;Dry needling;Spinal Manipulations;Joint Manipulations;Aquatic Therapy;Electrical Stimulation;Iontophoresis 45m/ml Dexamethasone;Traction    PT Next Visit Plan posture, trunk, hip strengthening, gentle lumbar extension, manual techniques, modalities PRN    PT Home Exercise Plan [email or web address]  Access Code: [email or web address]   Consulted and Agree with Plan of Care Patient              MJoneen BoersPT, DPT  10/21/2022, 6:01 PM

## 2022-10-23 ENCOUNTER — Ambulatory Visit

## 2022-10-23 DIAGNOSIS — M5416 Radiculopathy, lumbar region: Secondary | ICD-10-CM

## 2022-10-23 DIAGNOSIS — M5459 Other low back pain: Secondary | ICD-10-CM

## 2022-10-23 NOTE — Therapy (Signed)
OUTPATIENT PHYSICAL THERAPY TREATMENT NOTE   Patient Name: Craig Rios MRN: [id number] DOB:08/07/1981, 42 y.o., male Today's Date: 10/23/2022  PCP: Pleas Koch, NP  REFERRING PROVIDER: Pleas Koch, NP   END OF SESSION:  PT End of Session - 10/23/22 1720     Visit Number 8    Number of Visits 17    Date for PT Re-Evaluation 11/07/22    PT Start Time 1720    PT Stop Time 1801    PT Time Calculation (min) 41 min    Activity Tolerance Patient tolerated treatment well    Behavior During Therapy [facility] for tasks assessed/performed                   Past Medical History:  Diagnosis Date   Blood in stool    Ulcerative colitis [facility])    Past Surgical History:  Procedure Laterality Date   WISDOM TOOTH EXTRACTION     Patient Active Problem List   Diagnosis Date Noted   Upper respiratory infection, acute 09/07/2021   Chronic back pain 08/17/2021   Rash and nonspecific skin eruption 08/17/2021   Lower extremity edema 03/06/2020   OSA (obstructive sleep apnea) 11/18/2019   Snoring 04/02/2019   Ulcerative colitis ([place]) 04/02/2019    REFERRING DIAG: M54.50,G89.29 (ICD-10-CM) - Chronic bilateral low back pain without sciatica   THERAPY DIAG:  Other low back pain  Lumbar radiculitis  Rationale for Evaluation and Treatment Rehabilitation  PERTINENT HISTORY:   low back pain. Pain began around 2010. Pt was in the [employer] and lifted a tent. pulled his back. Normally gets muscle relaxers and call it a day. However, his back currently pops and it has never done that before. The popping began early November 2023. Pain is located L lower back. Pain has been the same. Pain shifts from L to R side to his glutes. Denies loss of bowel or bladder control, no LE paresthesia.    PRECAUTIONS: No known precautions  SUBJECTIVE:    SUBJECTIVE STATEMENT: Just the normal stiffness. No pain.     PAIN:  Are you having pain? See subjective    TODAY'S  TREATMENT:                                                                                                                                         DATE: 10/23/2022  Decreased L low back tightness with R lateral shift correction.   (-) repeated flexion test. R L4/L5 area popping heard.  (+) Slump L LE with symptoms. (+) Slump R LE.  (+) long sit test suggesting anterior nutation of L innominate.    L lumbar side bend directional preference   No latex allergies   Therapeutic exercise   Supine lower trunk rotation 10x5 seconds each side  Supine R hip IR stretch with PT 1 min x 2  Supine hip flexor stretch with LE  hanging off table  R 30 seconds x 3  L 30 seconds x 3  L side planks 15 seconds x 3  Planks regular 30 seconds x 3  Suitcase carry with 40 lb kettle bell on R side 300 ft  Dead lifts with 40 lbs kettle bell 10x3  Prone glute max extension (alternating)  R 10x5 seconds, then 5x2 with 5 second holds  L 10x5 seconds, then 5x2 with 5 second holds  Standing trunk extension to the L 10x5 seconds for 2 sets    Improved exercise technique, movement at target joints, use of target muscles after mod verbal, visual, tactile cues.        Response to treatment Pt tolerated session well without aggravation of symptoms.   Clinical impression  Continued working on improving B hip extension, posture, trunk and glute strength to decrease stress to affected areas. Pt tolerated session well without aggravation of symptoms. Pt will benefit from continued skilled physical therapy services to decrease pain, improve strength and function.        PATIENT EDUCATION: Education details: there-ex, HEP Person educated: Patient Education method: Explanation, Demonstration, Tactile cues, Verbal cues, and Handouts Education comprehension: verbalized understanding and returned demonstration  HOME EXERCISE PROGRAM: Access Code: [email or web address] URL:  [email or web address] Date: 09/30/2022 Prepared by: Joneen Boers  Exercises - Right Standing Lateral Shift Correction at [place]  - 3 x daily - 7 x weekly - 3 sets - 10 reps - 5 seconds hold - Prone Quadriceps Set  - 1 x daily - 7 x weekly - 3 sets - 10 reps - 5 seconds hold - Standing Hip Flexor Stretch  - 3 x daily - 7 x weekly - 1 sets - 3 reps - 30 seconds hold - Standing Sidebends  - 3 x daily - 7 x weekly - 3 sets - 10 reps - 10 seconds hold - Shoulder extension with resistance - Neutral  - 1 x daily - 7 x weekly - 3 sets - 10 reps - Hip Flexor Stretch at Edge of Bed  - 3 x daily - 7 x weekly - 1 sets - 3 reps - 30 seconds to 2 minutes  hold - Prone on Elbows Stretch  - 2 x daily - 7 x weekly - 1 sets - 2 reps - 2 minutes hold - Prone Press Up  - 1 x daily - 7 x weekly - 3 sets - 10 reps - 5 seconds  hold - Side Stepping with Resistance at Ankles  - 2 x daily - 7 x weekly - 1 sets - 1 reps lue theratube - Standing Lumbar Extension  - 2-3 x daily - 7 x weekly - 3 sets - 10 reps - 5 seconds hold   PT Long Term Goals - 10/21/22 1756       PT LONG TERM GOAL #1   Title Pt will improve his lumbar spine FOTO score by at least 10 points as a demonstration of improved function.    Baseline Lumbar Spine FOTO 51 (09/11/2022); 65 (10/14/2022)    Time 8    Period Weeks    Status Achieved    Target Date 11/07/22      PT LONG TERM GOAL #2   Title Pt will improve bilateral hip extension and abduction strength by at least 1/2 MMT to promote ability to perform standing tasks more comfortably.    Baseline Hip extension 4-/5 R, 4/5 L, hip abduction 4/5 R and L (09/11/2022)  Time 8    Period Weeks    Status On-going    Target Date 11/07/22      PT LONG TERM GOAL #3   Title Pt will have a decrease in low back pain to 3/10 or less at worst to promote ability to perform standing taks, bend over to pick up items, as well as to return to jogging for fitness more comfortably.     Baseline At least 5/10 at worst (5/10 low back pain currently) (09/11/2022); Pt states back pain was 10/10 at worst prior to starting PT on 09/11/2022 (10/21/2022).    Time 8    Period Weeks    Status On-going    Target Date 11/07/22              Plan - 10/23/22 1808     Clinical Impression Statement Continued working on improving B hip extension, posture, trunk and glute strength to decrease stress to affected areas. Pt tolerated session well without aggravation of symptoms. Pt will benefit from continued skilled physical therapy services to decrease pain, improve strength and function.    Personal Factors and Comorbidities Time since onset of injury/illness/exacerbation;Profession    Examination-Activity Limitations Lift;Sit;Stand;Bend    Stability/Clinical Decision Making Stable/Uncomplicated    Rehab Potential Good    PT Frequency 2x / week    PT Duration 8 weeks    PT Treatment/Interventions Therapeutic activities;Therapeutic exercise;Neuromuscular re-education;Patient/family education;Manual techniques;Dry needling;Spinal Manipulations;Joint Manipulations;Aquatic Therapy;Electrical Stimulation;Iontophoresis 58m/ml Dexamethasone;Traction    PT Next Visit Plan posture, trunk, hip strengthening, gentle lumbar extension, manual techniques, modalities PRN    PT Home Exercise Plan medbridge  Access Code: [email or web address]   Consulted and Agree with Plan of Care Patient               MJoneen BoersPT, DPT  10/23/2022, 6:09 PM

## 2022-10-28 ENCOUNTER — Ambulatory Visit

## 2022-10-28 DIAGNOSIS — M5459 Other low back pain: Secondary | ICD-10-CM | POA: Diagnosis not present

## 2022-10-28 DIAGNOSIS — M5416 Radiculopathy, lumbar region: Secondary | ICD-10-CM

## 2022-10-28 NOTE — Therapy (Signed)
OUTPATIENT PHYSICAL THERAPY TREATMENT NOTE   Patient Name: Craig Rios MRN: [id number] DOB:03/20/1981, 42 y.o., male Today's Date: 10/28/2022  PCP: Pleas Koch, NP  REFERRING PROVIDER: Pleas Koch, NP   END OF SESSION:  PT End of Session - 10/28/22 1719     Visit Number 9    Number of Visits 17    Date for PT Re-Evaluation 11/07/22    PT Start Time 1717    PT Stop Time 1756    PT Time Calculation (min) 39 min    Activity Tolerance Patient tolerated treatment well    Behavior During Therapy [facility] for tasks assessed/performed                   Past Medical History:  Diagnosis Date   Blood in stool    Ulcerative colitis [facility])    Past Surgical History:  Procedure Laterality Date   WISDOM TOOTH EXTRACTION     Patient Active Problem List   Diagnosis Date Noted   Upper respiratory infection, acute 09/07/2021   Chronic back pain 08/17/2021   Rash and nonspecific skin eruption 08/17/2021   Lower extremity edema 03/06/2020   OSA (obstructive sleep apnea) 11/18/2019   Snoring 04/02/2019   Ulcerative colitis ([place]) 04/02/2019    REFERRING DIAG: M54.50,G89.29 (ICD-10-CM) - Chronic bilateral low back pain without sciatica   THERAPY DIAG:  Other low back pain  Lumbar radiculitis  Rationale for Evaluation and Treatment Rehabilitation  PERTINENT HISTORY:   low back pain. Pain began around 2010. Pt was in the [employer] and lifted a tent. pulled his back. Normally gets muscle relaxers and call it a day. However, his back currently pops and it has never done that before. The popping began early November 2023. Pain is located L lower back. Pain has been the same. Pain shifts from L to R side to his glutes. Denies loss of bowel or bladder control, no LE paresthesia.    PRECAUTIONS: No known precautions  SUBJECTIVE:    SUBJECTIVE STATEMENT: Pt denies pain today. Worst pain since last session 3-4/10 NPS.     PAIN:  Are you having pain? See  subjective    TODAY'S TREATMENT:                                                                                                                                         DATE: 10/26/2022  Decreased L low back tightness with R lateral shift correction.   (-) repeated flexion test. R L4/L5 area popping heard.  (+) Slump L LE with symptoms. (+) Slump R LE.  (+) long sit test suggesting anterior nutation of L innominate.    L lumbar side bend directional preference   No latex allergies    Therapeutic exercise   Supine lower trunk rotation: 2x20   Supine R hip IR stretch with PT 2 x 1 minute   Prone  glute max extension (alternating)  R 10x5 seconds, then 5x2 with 5 second holds  L 10x5 seconds, then 5x2 with 5 second holds  3x10, 40# KB dead lifts. Min to mod VC's for form/technique.   Farmers carry in [employer]. 2x300'   Prone superman's: 4x6, pt quick to fatigue. Alternating all 4's to contralateral UE and LE.   Russian twists: 2x15/side. 2 KG med ball.   [place]off anti press with blue TB. X5 step outs with x5 presses/side    Improved exercise technique, movement at target joints, use of target muscles after mod verbal, visual, tactile cues.     Response to treatment Pt tolerated session well without aggravation of symptoms.   Clinical impression Continuing PT POC with heavy focus on core stability. Pt without exacerbation of LBP throughout session demonstrating increased WOB and needed time for standing rest breaks between reps and sets due to increased volume. Pt reports improvement in Low back tightness post core activation exercises. Pt will benefit from continued skilled physical therapy services to decrease pain, improve strength and function.       PATIENT EDUCATION: Education details: there-ex, HEP Person educated: Patient Education method: Explanation, Demonstration, Tactile cues, Verbal cues, and Handouts Education comprehension: verbalized understanding and  returned demonstration  HOME EXERCISE PROGRAM: Access Code: [email or web address] URL: [email or web address] Date: 09/30/2022 Prepared by: Joneen Boers  Exercises - Right Standing Lateral Shift Correction at [place]  - 3 x daily - 7 x weekly - 3 sets - 10 reps - 5 seconds hold - Prone Quadriceps Set  - 1 x daily - 7 x weekly - 3 sets - 10 reps - 5 seconds hold - Standing Hip Flexor Stretch  - 3 x daily - 7 x weekly - 1 sets - 3 reps - 30 seconds hold - Standing Sidebends  - 3 x daily - 7 x weekly - 3 sets - 10 reps - 10 seconds hold - Shoulder extension with resistance - Neutral  - 1 x daily - 7 x weekly - 3 sets - 10 reps - Hip Flexor Stretch at Edge of Bed  - 3 x daily - 7 x weekly - 1 sets - 3 reps - 30 seconds to 2 minutes  hold - Prone on Elbows Stretch  - 2 x daily - 7 x weekly - 1 sets - 2 reps - 2 minutes hold - Prone Press Up  - 1 x daily - 7 x weekly - 3 sets - 10 reps - 5 seconds  hold - Side Stepping with Resistance at Ankles  - 2 x daily - 7 x weekly - 1 sets - 1 reps lue theratube - Standing Lumbar Extension  - 2-3 x daily - 7 x weekly - 3 sets - 10 reps - 5 seconds hold   PT Long Term Goals - 10/21/22 1756       PT LONG TERM GOAL #1   Title Pt will improve his lumbar spine FOTO score by at least 10 points as a demonstration of improved function.    Baseline Lumbar Spine FOTO 51 (09/11/2022); 65 (10/14/2022)    Time 8    Period Weeks    Status Achieved    Target Date 11/07/22      PT LONG TERM GOAL #2   Title Pt will improve bilateral hip extension and abduction strength by at least 1/2 MMT to promote ability to perform standing tasks more comfortably.    Baseline Hip extension 4-/5 R, 4/5 L, hip  abduction 4/5 R and L (09/11/2022)    Time 8    Period Weeks    Status On-going    Target Date 11/07/22      PT LONG TERM GOAL #3   Title Pt will have a decrease in low back pain to 3/10 or less at worst to promote ability to perform standing taks, bend over to  pick up items, as well as to return to jogging for fitness more comfortably.    Baseline At least 5/10 at worst (5/10 low back pain currently) (09/11/2022); Pt states back pain was 10/10 at worst prior to starting PT on 09/11/2022 (10/21/2022).    Time 8    Period Weeks    Status On-going    Target Date 11/07/22                 Salem Caster. Fairly IV, PT, DPT Physical Therapist- [facility]  10/28/2022, 5:58 PM

## 2022-10-30 ENCOUNTER — Encounter

## 2022-10-30 ENCOUNTER — Telehealth: Payer: Self-pay

## 2022-10-30 ENCOUNTER — Ambulatory Visit

## 2022-10-30 NOTE — Telephone Encounter (Signed)
No show. Called patient and left a message pertaining to appointment and a reminder for the next follow up session. Return phone call requested. Phone number ([phone]) provided.   

## 2022-11-04 ENCOUNTER — Ambulatory Visit

## 2022-11-06 ENCOUNTER — Ambulatory Visit

## 2022-11-14 ENCOUNTER — Telehealth: Payer: Self-pay | Admitting: Primary Care

## 2022-12-17 LAB — HM COLONOSCOPY

## 2023-02-06 NOTE — Telephone Encounter (Signed)
Error

## 2023-02-12 NOTE — Progress Notes (Deleted)
08/12/22- 41 yoM former smoker followed for OSA, complicated by Ulcerative Colitis, Back Pain,  HST 11/04/19- AHI 39.1/ hr , desaturation to 81%, body weight 202 lbs CPAP auto 5-15/ [employer] LOV Walsh, NP 05/08/21 Covid vax- 3 Phizer Flu vax- had Body weight today 210 lbs Download compliance 37%, AHI 0.8/ hr We discussed CPAP use. He says he doesn't mind using it with full-face mask, but the refers to stuffy nose, kids. I reviewed compliance goals and alternatives to CPAP. He is interested in exploring oral appliance therapy. He mentions exploring a claim against [facility] for [facility] sleep apnea and will request copy of records.  02/17/23- 41 yoM former smoker followed for OSA, complicated by Ulcerative Colitis, Back Pain,  CPAP auto 5-15/ [employer] Download compliance- Body weight today-    ROS-see HPI  + = positive Constitutional:    weight loss, night sweats, fevers, chills, fatigue, lassitude. HEENT:    headaches, difficulty swallowing, tooth/dental problems, sore throat,       sneezing, itching, ear ache, nasal congestion, post nasal drip, snoring CV:    chest pain, orthopnea, PND, swelling in lower extremities, anasarca,                                  dizziness, palpitations Resp:   shortness of breath with exertion or at rest.                productive cough,   non-productive cough, coughing up of blood.              change in color of mucus.  wheezing.   Skin:    rash or lesions. GI:  No-   heartburn, indigestion, abdominal pain, nausea, vomiting, diarrhea,                 change in bowel habits, loss of appetite GU: dysuria, change in color of urine, no urgency or frequency.   flank pain. MS:   joint pain, stiffness, decreased range of motion, back pain. Neuro-     nothing unusual Psych:  change in mood or affect.  depression or anxiety.   memory loss.  OBJ- Physical Exam General- Alert, Oriented, Affect-appropriate, Distress- none acute Skin- rash-none, lesions- none, excoriation-  none Lymphadenopathy- none Head- atraumatic            Eyes- Gross vision intact, PERRLA, conjunctivae and secretions clear            Ears- Hearing, canals-normal            Nose- No obvious congestion,  no-Septal dev, mucus, polyps, erosion, perforation             Throat- Mallampati III-IV , mucosa clear , drainage- none, tonsils- atrophic, +teeth Neck- flexible , trachea midline, no stridor , thyroid nl, carotid no bruit Chest - symmetrical excursion , unlabored           Heart/CV- RRR , no murmur , no gallop  , no rub, nl s1 s2                           - JVD- none , edema- none, stasis changes- none, varices- none           Lung- clear to P&A, wheeze- none, cough- none , dullness-none, rub- none           Chest wall-  Abd-  Br/ Gen/  Rectal- Not done, not indicated Extrem- cyanosis- none, clubbing, none, atrophy- none, strength- nl Neuro- grossly intact to observation

## 2023-02-17 ENCOUNTER — Ambulatory Visit: Admitting: Internal Medicine

## 2023-07-04 ENCOUNTER — Ambulatory Visit: Admitting: Primary Care

## 2023-07-10 ENCOUNTER — Ambulatory Visit: Admitting: Primary Care

## 2023-07-11 ENCOUNTER — Encounter: Payer: Self-pay | Admitting: Primary Care

## 2023-10-23 ENCOUNTER — Telehealth: Payer: Self-pay | Admitting: Primary Care

## 2023-10-23 NOTE — Telephone Encounter (Signed)
Copied from CRM [id number]. Topic: General - Other >> Oct 23, 2023  3:59 PM Theodis Sato wrote: Reason for CRM: Rose from [facility] will be re faxing a request for patients CPAP supplies.

## 2023-10-24 NOTE — Telephone Encounter (Signed)
Noted. Patient has not been seen by me since November of 2023. Also, looks like he previously followed with pulmonology, Dr. Jetty Duhamel for sleep apnea.  Please set him up for CPE with me.  Have [facility] send CPAP request to pulmonology

## 2023-10-24 NOTE — Telephone Encounter (Signed)
Called and spoke with patient, he states he is not due for more supplies yet but will reach out to pulmonologist when needed .Scheduled CPE.

## 2023-11-03 ENCOUNTER — Encounter: Admitting: Primary Care

## 2023-11-04 ENCOUNTER — Encounter: Payer: Self-pay | Admitting: Primary Care

## 2023-11-06 ENCOUNTER — Ambulatory Visit (INDEPENDENT_AMBULATORY_CARE_PROVIDER_SITE_OTHER): Admitting: Primary Care

## 2023-11-06 VITALS — BP 116/74 | HR 100 | Temp 97.6°F | Ht 68.0 in | Wt 204.0 lb

## 2023-11-06 DIAGNOSIS — K51919 Ulcerative colitis, unspecified with unspecified complications: Secondary | ICD-10-CM

## 2023-11-06 DIAGNOSIS — M545 Low back pain, unspecified: Secondary | ICD-10-CM | POA: Diagnosis not present

## 2023-11-06 DIAGNOSIS — G8929 Other chronic pain: Secondary | ICD-10-CM

## 2023-11-06 DIAGNOSIS — G4733 Obstructive sleep apnea (adult) (pediatric): Secondary | ICD-10-CM | POA: Diagnosis not present

## 2023-11-06 DIAGNOSIS — Z0000 Encounter for general adult medical examination without abnormal findings: Secondary | ICD-10-CM | POA: Insufficient documentation

## 2023-11-06 LAB — LIPID PANEL
Cholesterol: 167 mg/dL (ref 0–200)
HDL: 25.5 mg/dL — ABNORMAL LOW (ref >39.00)
LDL Cholesterol: 98 mg/dL (ref 0–99)
NonHDL: 141.35
Total CHOL/HDL Ratio: 7
Triglycerides: 219 mg/dL — ABNORMAL HIGH (ref 0.0–149.0)
VLDL: 43.8 mg/dL — ABNORMAL HIGH (ref 0.0–40.0)

## 2023-11-06 LAB — CBC
HCT: 46.1 % (ref 39.0–52.0)
Hemoglobin: 15.3 g/dL (ref 13.0–17.0)
MCHC: 33.2 g/dL (ref 30.0–36.0)
MCV: 89.5 fl (ref 78.0–100.0)
Platelets: 309 10*3/uL (ref 150.0–400.0)
RBC: 5.16 Mil/uL (ref 4.22–5.81)
RDW: 12.4 % (ref 11.5–15.5)
WBC: 5.5 10*3/uL (ref 4.0–10.5)

## 2023-11-06 LAB — COMPREHENSIVE METABOLIC PANEL
ALT: 27 U/L (ref 0–53)
AST: 16 U/L (ref 0–37)
Albumin: 4.5 g/dL (ref 3.5–5.2)
Alkaline Phosphatase: 131 U/L — ABNORMAL HIGH (ref 39–117)
BUN: 12 mg/dL (ref 6–23)
CO2: 29 meq/L (ref 19–32)
Calcium: 9.6 mg/dL (ref 8.4–10.5)
Chloride: 101 meq/L (ref 96–112)
Creatinine, Ser: 1.28 mg/dL (ref 0.40–1.50)
GFR: 68.68 mL/min (ref >60.00)
Glucose, Bld: 315 mg/dL — ABNORMAL HIGH (ref 70–99)
Potassium: 4.4 meq/L (ref 3.5–5.1)
Sodium: 137 meq/L (ref 135–145)
Total Bilirubin: 1.3 mg/dL — ABNORMAL HIGH (ref 0.2–1.2)
Total Protein: 7.6 g/dL (ref 6.0–8.3)

## 2023-11-06 LAB — HEMOGLOBIN A1C: Hgb A1c MFr Bld: 9.3 % — ABNORMAL HIGH (ref 4.6–6.5)

## 2023-11-06 NOTE — Assessment & Plan Note (Signed)
 Stable. No concerns today

## 2023-11-06 NOTE — Patient Instructions (Signed)
 Stop by the lab prior to leaving today. I will notify you of your results once received.   It was a pleasure to see you today!

## 2023-11-06 NOTE — Assessment & Plan Note (Addendum)
 Controlled. Following with GI in [place].  Continue Lialda 4.8 g daily. Colonoscopy up-to-date.

## 2023-11-06 NOTE — Assessment & Plan Note (Signed)
 Immunizations UTD.  Colonoscopy UTD, follows with GI Discussed the importance of a healthy diet and regular exercise in order for weight loss, and to reduce the risk of further co-morbidity.  Exam stable. Labs pending.  Follow up in 1 year for repeat physical.

## 2023-11-06 NOTE — Assessment & Plan Note (Signed)
 Continue CPAP.

## 2023-11-06 NOTE — Progress Notes (Signed)
 Subjective:    Patient ID: Kimmie Doren, male    DOB: 02-May-1981, 43 y.o.   MRN: [id number]  HPI  Emry Barbato is a very pleasant 43 y.o. male who presents today for complete physical and follow up of chronic conditions.  Immunizations: -Tetanus: Completed in 2021 -Influenza: Completed this season   Diet: Fair diet.  Exercise: Waking  Eye exam: Completed 2 year ago  Dental exam: Completes semi-annually            Review of Systems  Constitutional:  Negative for unexpected weight change.  HENT:  Negative for rhinorrhea.   Respiratory:  Negative for cough and shortness of breath.   Cardiovascular:  Negative for chest pain.  Gastrointestinal:  Negative for constipation and diarrhea.  Genitourinary:  Negative for difficulty urinating.  Musculoskeletal:  Negative for arthralgias and myalgias.  Skin:  Negative for rash.  Allergic/Immunologic: Negative for environmental allergies.  Neurological:  Negative for dizziness, numbness and headaches.  Psychiatric/Behavioral:  The patient is not nervous/anxious.          Past Medical History:  Diagnosis Date   Blood in stool    Lower extremity edema 03/06/2020   Ulcerative colitis (HCC)     Social History   Socioeconomic History   Marital status: Single    Spouse name: Not on file   Number of children: Not on file   Years of education: Not on file   Highest education level: Associate degree: occupational, Scientist, product/process development, or vocational program  Occupational History   Not on file  Tobacco Use   Smoking status: Former    Current packs/day: 0.50    Average packs/day: 0.5 packs/day for 15.0 years (7.5 ttl pk-yrs)    Types: Cigarettes   Smokeless tobacco: Never  Vaping Use   Vaping status: Never Used  Substance and Sexual Activity   Alcohol use: Yes   Drug use: No   Sexual activity: Not on file  Other Topics Concern   Not on file  Social History Narrative   Single.   Works for [employer].   2 children.    Social Drivers of Corporate investment banker Strain: Low Risk  (11/06/2023)   Overall Financial Resource Strain (CARDIA)    Difficulty of Paying Living Expenses: Not hard at all  Food Insecurity: No Food Insecurity (11/06/2023)   Hunger Vital Sign    Worried About Running Out of Food in the Last Year: Never true    Ran Out of Food in the Last Year: Never true  Transportation Needs: No Transportation Needs (11/06/2023)   PRAPARE - Administrator, Civil Service (Medical): No    Lack of Transportation (Non-Medical): No  Physical Activity: Sufficiently Active (11/06/2023)   Exercise Vital Sign    Days of Exercise per Week: 3 days    Minutes of Exercise per Session: 60 min  Stress: No Stress Concern Present (11/06/2023)   [name] of Occupational Health - Occupational Stress Questionnaire    Feeling of Stress : Only a little  Social Connections: Socially Isolated (11/06/2023)   Social Connection and Isolation Panel [NHANES]    Frequency of Communication with Friends and Family: Once a week    Frequency of Social Gatherings with Friends and Family: Once a week    Attends Religious Services: 1 to 4 times per year    Active Member of [employer] or Organizations: No    Attends Banker Meetings: Not on file    Marital Status:  Never married  Catering manager Violence: Not on file    Past Surgical History:  Procedure Laterality Date   WISDOM TOOTH EXTRACTION      Family History  Problem Relation Age of Onset   Stroke Mother    Heart attack Father     No Known Allergies  Current Outpatient Medications on File Prior to Visit  Medication Sig Dispense Refill   mesalamine (LIALDA) 1.2 G EC tablet Take 1,200 mg by mouth 4 (four) times daily.     No current facility-administered medications on file prior to visit.    BP 116/74   Pulse 100   Temp 97.6 F (36.4 C) (Temporal)   Ht 5\' 8"  (1.727 m)   Wt 204 lb (92.5 kg)   SpO2 97%   BMI 31.02 kg/m   Objective:   Physical Exam HENT:     Right Ear: Tympanic membrane and ear canal normal.     Left Ear: Tympanic membrane and ear canal normal.  Eyes:     Pupils: Pupils are equal, round, and reactive to light.  Cardiovascular:     Rate and Rhythm: Normal rate and regular rhythm.  Pulmonary:     Effort: Pulmonary effort is normal.     Breath sounds: Normal breath sounds.  Abdominal:     General: Bowel sounds are normal.     Palpations: Abdomen is soft.     Tenderness: There is no abdominal tenderness.  Musculoskeletal:        General: Normal range of motion.     Cervical back: Neck supple.  Skin:    General: Skin is warm and dry.  Neurological:     Mental Status: He is alert and oriented to person, place, and time.     Cranial Nerves: No cranial nerve deficit.     Deep Tendon Reflexes:     Reflex Scores:      Patellar reflexes are 2+ on the right side and 2+ on the left side. Psychiatric:        Mood and Affect: Mood normal.           Assessment & Plan:  Preventative health care Assessment & Plan: Immunizations UTD.  Colonoscopy UTD, follows with GI Discussed the importance of a healthy diet and regular exercise in order for weight loss, and to reduce the risk of further co-morbidity.  Exam stable. Labs pending.  Follow up in 1 year for repeat physical.   Orders: -     CBC -     Comprehensive metabolic panel -     Lipid panel -     Hemoglobin A1c  OSA (obstructive sleep apnea) Assessment & Plan: Continue CPAP.     Ulcerative colitis with complication, unspecified location [facility]) Assessment & Plan: Controlled. Following with GI in [place].  Continue Lialda 4.8 g daily. Colonoscopy up-to-date.   Chronic bilateral low back pain without sciatica Assessment & Plan: Stable.  No concerns today.         Doreene Nest, NP

## 2023-11-07 ENCOUNTER — Other Ambulatory Visit: Payer: Self-pay | Admitting: Primary Care

## 2023-11-07 DIAGNOSIS — E1165 Type 2 diabetes mellitus with hyperglycemia: Secondary | ICD-10-CM | POA: Insufficient documentation

## 2023-11-07 MED ORDER — BLOOD GLUCOSE TEST VI STRP: 1.0000 | ORAL_STRIP | Freq: Three times a day (TID) | 4 refills | Status: AC

## 2023-11-07 MED ORDER — LANCET DEVICE MISC: 1.0000 | Freq: Three times a day (TID) | 0 refills | Status: AC

## 2023-11-07 MED ORDER — BLOOD GLUCOSE MONITORING SUPPL DEVI: 1.0000 | Freq: Three times a day (TID) | 0 refills | Status: AC

## 2023-11-07 MED ORDER — LANCETS MISC. MISC: 1.0000 | Freq: Three times a day (TID) | 4 refills | Status: AC

## 2023-11-07 MED ORDER — METFORMIN HCL ER 500 MG PO TB24: 500.0000 mg | ORAL_TABLET | Freq: Every day | ORAL | 0 refills | Status: DC

## 2023-11-07 NOTE — Assessment & Plan Note (Signed)
 New diagnosis with A1c of 9.3. See result note.  Start metformin ER 500 mg daily with breakfast. Glucometer kit sent to pharmacy. Referral placed for diabetes education and nutrition.

## 2023-12-05 ENCOUNTER — Telehealth: Payer: Self-pay | Admitting: Primary Care

## 2023-12-05 NOTE — Telephone Encounter (Signed)
 Copied from CRM [id number]. Topic: General - Other >> Dec 05, 2023 12:11 PM Eunice Blase wrote: Reason for CRM: Received call from [facility] per Mabs ph: [phone] fax: [phone] sent fax on 11/27/2023 regarding CPAP supplies. Please contact.

## 2023-12-05 NOTE — Telephone Encounter (Signed)
 Called and advised [facility] the order for CPAP supplies needs to go to patients pulmonologist Dr. Jetty Duhamel.

## 2023-12-29 ENCOUNTER — Ambulatory Visit: Admitting: Dietician

## 2024-01-01 ENCOUNTER — Encounter: Payer: Self-pay | Admitting: Primary Care

## 2024-01-01 ENCOUNTER — Telehealth: Admitting: Primary Care

## 2024-01-01 VITALS — Ht 68.0 in | Wt 198.0 lb

## 2024-01-01 DIAGNOSIS — L739 Follicular disorder, unspecified: Secondary | ICD-10-CM | POA: Diagnosis not present

## 2024-01-01 NOTE — Progress Notes (Signed)
 Patient ID: Craig Rios, male    DOB: 05/21/1981, 43 y.o.   MRN: [id number]  Virtual visit completed through caregility, a video enabled telemedicine application. Due to national recommendations of social distancing due to COVID-19, a virtual visit is felt to be most appropriate for this patient at this time. Reviewed limitations, risks, security and privacy concerns of performing a virtual visit and the availability of in person appointments. I also reviewed that there may be a patient responsible charge related to this service. The patient agreed to proceed.   Patient location: home Provider location: [place] at [facility], office Persons participating in this virtual visit: patient, provider   If any vitals were documented, they were collected by patient at home unless specified below.    Ht 5\' 8"  (1.727 m)   Wt 198 lb (89.8 kg)   BMI 30.11 kg/m    CC: Shaving Excuse Subjective:   HPI: Craig Rios is a 43 y.o. male presenting on 01/01/2024 for Skin Problem (Wants to discuss getting shaving waiver for [employer]. 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 This role requires a clean shaven face for men. He is requesting a waiver to excuse him from shaving his face clean. He has a history of ingrown hairs which causes irritation and bleeding when he shaves his face clean. He keeps his facial hair trimmed.       Relevant past medical, surgical, family and social history reviewed and updated as indicated. Interim medical history since our last visit reviewed. Allergies and medications reviewed and updated. Outpatient Medications Prior to Visit  Medication Sig Dispense Refill   Blood Glucose Monitoring Suppl DEVI 1 each by Does not apply route in the morning, at noon, and at bedtime. May substitute to any manufacturer covered by patient's insurance. 1 each 0   Glucose Blood (BLOOD GLUCOSE TEST STRIPS) STRP 1 each by In Vitro route in the morning, at  noon, and at bedtime. May substitute to any manufacturer covered by patient's insurance. 100 strip 4   Lancets Misc. MISC 1 each by Does not apply route in the morning, at noon, and at bedtime. May substitute to any manufacturer covered by patient's insurance. 100 each 4   mesalamine (LIALDA) 1.2 G EC tablet Take 1,200 mg by mouth 4 (four) times daily.     metFORMIN  (GLUCOPHAGE -XR) 500 MG 24 hr tablet Take 1 tablet (500 mg total) by mouth daily with breakfast. for diabetes. 90 tablet 0   No facility-administered medications prior to visit.     Per HPI unless specifically indicated in ROS section below Review of Systems  Skin:  Negative for color change and rash.   Objective:  Ht 5\' 8"  (1.727 m)   Wt 198 lb (89.8 kg)   BMI 30.11 kg/m   Wt Readings from Last 3 Encounters:  01/01/24 198 lb (89.8 kg)  11/06/23 204 lb (92.5 kg)  08/12/22 210 lb 3.2 oz (95.3 kg)       Physical exam: General: Alert and oriented x 3, no distress, does not appear sickly  Pulmonary: Speaks in complete sentences without increased work of breathing, no cough during visit.  Psychiatric: Normal mood, thought content, and behavior.     Results for orders placed or performed in visit on 11/06/23  CBC   Collection Time: 11/06/23 11:23 AM  Result Value Ref Range   WBC 5.5 4.0 - 10.5 K/uL   RBC 5.16 4.22 - 5.81 Mil/uL  Platelets 309.0 150.0 - 400.0 K/uL   Hemoglobin 15.3 13.0 - 17.0 g/dL   HCT 96.2 95.2 - 84.1 %   MCV 89.5 78.0 - 100.0 fl   MCHC 33.2 30.0 - 36.0 g/dL   RDW 32.4 40.1 - 02.7 %  Comprehensive metabolic panel   Collection Time: 11/06/23 11:23 AM  Result Value Ref Range   Sodium 137 135 - 145 mEq/L   Potassium 4.4 3.5 - 5.1 mEq/L   Chloride 101 96 - 112 mEq/L   CO2 29 19 - 32 mEq/L   Glucose, Bld 315 (H) 70 - 99 mg/dL   BUN 12 6 - 23 mg/dL   Creatinine, Ser 2.53 0.40 - 1.50 mg/dL   Total Bilirubin 1.3 (H) 0.2 - 1.2 mg/dL   Alkaline Phosphatase 131 (H) 39 - 117 U/L   AST 16 0 - 37 U/L    ALT 27 0 - 53 U/L   Total Protein 7.6 6.0 - 8.3 g/dL   Albumin 4.5 3.5 - 5.2 g/dL   GFR 66.44 >03.47 mL/min   Calcium 9.6 8.4 - 10.5 mg/dL  Lipid panel   Collection Time: 11/06/23 11:23 AM  Result Value Ref Range   Cholesterol 167 0 - 200 mg/dL   Triglycerides 425.9 (H) 0.0 - 149.0 mg/dL   HDL 56.38 (L) >75.64 mg/dL   VLDL 33.2 (H) 0.0 - 95.1 mg/dL   LDL Cholesterol 98 0 - 99 mg/dL   Total CHOL/HDL Ratio 7    NonHDL 141.35   Hemoglobin A1c   Collection Time: 11/06/23 11:23 AM  Result Value Ref Range   Hgb A1c MFr Bld 9.3 (H) 4.6 - 6.5 %   Assessment & Plan:   Problem List Items Addressed This Visit       Musculoskeletal and Integument   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  Agree to provide medical waiver to prevent facial folliculitis.  Continue with current shaving regimen.        No orders of the defined types were placed in this encounter.  No orders of the defined types were placed in this encounter.   I discussed the assessment and treatment plan with the patient. The patient was provided an opportunity to ask questions and all were answered. The patient agreed with the plan and demonstrated an understanding of the instructions. The patient was advised to call back or seek an in-person evaluation if the symptoms worsen or if the condition fails to improve as anticipated.  Follow up plan:  It was a pleasure to see you today!   Craig Rios K Jonus Coble, NP

## 2024-01-01 NOTE — Assessment & Plan Note (Signed)
 Chronic to face with shaving.  Agree to provide medical waiver to prevent facial folliculitis.  Continue with current shaving regimen.

## 2024-01-01 NOTE — Patient Instructions (Signed)
 It was a pleasure to see you today!

## 2024-01-06 ENCOUNTER — Telehealth: Payer: Self-pay

## 2024-01-06 NOTE — Telephone Encounter (Signed)
 Copied from CRM [id number]. Topic: General - Other >> Jan 06, 2024 11:13 AM Rosamond Comes wrote: Reason for CRM: Shelagh Derrick with [facility]  [phone] calling asking about CPAP prescription  Shelagh Derrick will re fax request 01/06/24

## 2024-01-07 ENCOUNTER — Telehealth: Admitting: Primary Care

## 2024-01-07 NOTE — Telephone Encounter (Signed)
 Form has been faxed to pulmonology. They order patients supplies.  No further action needed at this time.

## 2024-01-20 ENCOUNTER — Ambulatory Visit: Admitting: Internal Medicine

## 2024-02-06 ENCOUNTER — Ambulatory Visit: Admitting: Primary Care

## 2024-02-12 ENCOUNTER — Ambulatory Visit: Admitting: Primary Care

## 2024-02-12 ENCOUNTER — Ambulatory Visit: Payer: Self-pay | Admitting: Primary Care

## 2024-02-12 ENCOUNTER — Encounter: Payer: Self-pay | Admitting: Primary Care

## 2024-02-12 VITALS — BP 102/64 | HR 73 | Temp 97.5°F | Ht 68.0 in | Wt 206.0 lb

## 2024-02-12 DIAGNOSIS — Z7984 Long term (current) use of oral hypoglycemic drugs: Secondary | ICD-10-CM

## 2024-02-12 DIAGNOSIS — E1165 Type 2 diabetes mellitus with hyperglycemia: Secondary | ICD-10-CM

## 2024-02-12 DIAGNOSIS — L309 Dermatitis, unspecified: Secondary | ICD-10-CM | POA: Diagnosis not present

## 2024-02-12 LAB — POCT GLYCOSYLATED HEMOGLOBIN (HGB A1C): Hemoglobin A1C: 6.6 % — AB (ref 4.0–5.6)

## 2024-02-12 LAB — MICROALBUMIN / CREATININE URINE RATIO
Creatinine,U: 208.2 mg/dL
Microalb Creat Ratio: UNDETERMINED mg/g (ref 0.0–30.0)
Microalb, Ur: <0.7 mg/dL

## 2024-02-12 MED ORDER — TRIAMCINOLONE ACETONIDE 0.5 % EX OINT: 1.0000 | TOPICAL_OINTMENT | Freq: Two times a day (BID) | CUTANEOUS | 0 refills | Status: AC | PRN

## 2024-02-12 MED ORDER — METFORMIN HCL ER 500 MG PO TB24: 500.0000 mg | ORAL_TABLET | Freq: Every day | ORAL | 1 refills | Status: AC

## 2024-02-12 NOTE — Assessment & Plan Note (Signed)
 Improved with A1C of 6.6 today!  Commended him on improvements in diet, encouraged to continue. Increase physical activity.  Continue metformin  ER 500 mg daily. Foot exam today. Urine microalbumin due and pending.  Follow-up in 6 months.

## 2024-02-12 NOTE — Patient Instructions (Addendum)
 Stop by the lab prior to leaving today. I will notify you of your results once received.   Continue taking metformin  ER 500 mg once daily.  Continue work on [employer].  Please schedule a follow up visit for 6 months for a diabetes check.  It was a pleasure to see you today!

## 2024-02-12 NOTE — Addendum Note (Signed)
 Addended by: Aveah Castell K on: 02/12/2024 08:29 AM   Modules accepted: Orders

## 2024-02-12 NOTE — Progress Notes (Addendum)
 Subjective:    Patient ID: Craig Rios, male    DOB: 1981-07-12, 43 y.o.   MRN: [id number]  HPI  Craig Rios is a very pleasant 43 y.o. male with a history of OSA, type 2 diabetes, ulcerative colitis who presents today for follow-up diabetes.  He is also needing a refill of triamcinolone  ointment.  Current medications include: metformin  ER 500 mg daily. He is doing well on metformin .   He is checking his blood glucose 1 times daily and is getting readings of:  AM fasting: 80-120  Last A1C: 9.3 in February 2025, 6.6 today Last Eye Exam: Due Last Foot Exam: Due Pneumonia Vaccination: Never completed  Urine Microalbumin: Due Statin: None  Dietary changes since last visit: He has cut out sweet tea and soda, has been eating poorly over the last few weeks as he recently moved. He plans on getting back on track.    Exercise: None     Review of Systems  Gastrointestinal:  Negative for abdominal pain, constipation and diarrhea.  Endocrine: Negative for polydipsia, polyphagia and polyuria.  Neurological:  Negative for dizziness and numbness.         Past Medical History:  Diagnosis Date   Blood in stool    Lower extremity edema 03/06/2020   Ulcerative colitis (HCC)     Social History   Socioeconomic History   Marital status: Single    Spouse name: Not on file   Number of children: Not on file   Years of education: Not on file   Highest education level: Associate degree: occupational, Scientist, product/process development, or vocational program  Occupational History   Not on file  Tobacco Use   Smoking status: Former    Current packs/day: 0.50    Average packs/day: 0.5 packs/day for 15.0 years (7.5 ttl pk-yrs)    Types: Cigarettes   Smokeless tobacco: Never  Vaping Use   Vaping status: Never Used  Substance and Sexual Activity   Alcohol use: Yes   Drug use: No   Sexual activity: Not on file  Other Topics Concern   Not on file  Social History Narrative   Single.   Works  for [employer].   2 children.   Social Drivers of Corporate investment banker Strain: Low Risk  (11/06/2023)   Overall Financial Resource Strain (CARDIA)    Difficulty of Paying Living Expenses: Not hard at all  Food Insecurity: No Food Insecurity (11/06/2023)   Hunger Vital Sign    Worried About Running Out of Food in the Last Year: Never true    Ran Out of Food in the Last Year: Never true  Transportation Needs: No Transportation Needs (11/06/2023)   PRAPARE - Administrator, Civil Service (Medical): No    Lack of Transportation (Non-Medical): No  Physical Activity: Sufficiently Active (11/06/2023)   Exercise Vital Sign    Days of Exercise per Week: 3 days    Minutes of Exercise per Session: 60 min  Stress: No Stress Concern Present (11/06/2023)   [name] of Occupational Health - Occupational Stress Questionnaire    Feeling of Stress : Only a little  Social Connections: Socially Isolated (11/06/2023)   Social Connection and Isolation Panel [NHANES]    Frequency of Communication with Friends and Family: Once a week    Frequency of Social Gatherings with Friends and Family: Once a week    Attends Religious Services: 1 to 4 times per year    Active Member of Clubs or  Organizations: No    Attends Engineer, structural: Not on file    Marital Status: Never married  Intimate Partner Violence: Not on file    Past Surgical History:  Procedure Laterality Date   WISDOM TOOTH EXTRACTION      Family History  Problem Relation Age of Onset   Stroke Mother    Heart attack Father     No Known Allergies  Current Outpatient Medications on File Prior to Visit  Medication Sig Dispense Refill   Blood Glucose Monitoring Suppl DEVI 1 each by Does not apply route in the morning, at noon, and at bedtime. May substitute to any manufacturer covered by patient's insurance. 1 each 0   Glucose Blood (BLOOD GLUCOSE TEST STRIPS) STRP 1 each by In Vitro route in the  morning, at noon, and at bedtime. May substitute to any manufacturer covered by patient's insurance. 100 strip 4   Lancets Misc. MISC 1 each by Does not apply route in the morning, at noon, and at bedtime. May substitute to any manufacturer covered by patient's insurance. 100 each 4   mesalamine (LIALDA) 1.2 G EC tablet Take 1,200 mg by mouth 4 (four) times daily.     No current facility-administered medications on file prior to visit.    BP 102/64   Pulse 73   Temp (!) 97.5 F (36.4 C) (Temporal)   Ht 5\' 8"  (1.727 m)   Wt 206 lb (93.4 kg)   SpO2 96%   BMI 31.32 kg/m  Objective:   Physical Exam Cardiovascular:     Rate and Rhythm: Normal rate and regular rhythm.  Pulmonary:     Effort: Pulmonary effort is normal.     Breath sounds: Normal breath sounds.  Musculoskeletal:     Cervical back: Neck supple.  Skin:    General: Skin is warm and dry.  Neurological:     Mental Status: He is alert and oriented to person, place, and time.  Psychiatric:        Mood and Affect: Mood normal.           Assessment & Plan:  Type 2 diabetes mellitus with hyperglycemia, without long-term current use of insulin (HCC) Assessment & Plan: Improved with A1C of 6.6 today!  Commended him on improvements in diet, encouraged to continue. Increase physical activity.  Continue metformin  ER 500 mg daily. Foot exam today. Urine microalbumin due and pending.  Follow-up in 6 months.  Orders: -     POCT glycosylated hemoglobin (Hb A1C) -     Microalbumin / creatinine urine ratio -     metFORMIN  HCl ER; Take 1 tablet (500 mg total) by mouth daily with breakfast. for diabetes.  Dispense: 90 tablet; Refill: 1  Eczema, unspecified type Assessment & Plan: Continue triamcinolone  0.5% ointment twice daily as needed. Refill provided.  Orders: -     Triamcinolone  Acetonide; Apply 1 Application topically 2 (two) times daily as needed.  Dispense: 30 g; Refill: 0        Gabriel John,  NP

## 2024-02-12 NOTE — Assessment & Plan Note (Signed)
 Continue triamcinolone  0.5% ointment twice daily as needed. Refill provided.

## 2024-02-26 LAB — HM COLONOSCOPY

## 2024-03-18 NOTE — Progress Notes (Deleted)
 08/12/22- 41 yoM former smoker followed for OSA, complicated by Ulcerative Colitis, Back Pain,  HST 11/04/19- AHI 39.1/ hr , desaturation to 81%, body weight 202 lbs CPAP auto 5-15/ [facility] LOV Walsh, NP 05/08/21 Covid vax- 3 Phizer Flu vax- had Body weight today 210 lbs Download compliance 37%, AHI 0.8/ hr We discussed CPAP use. He says he doesn't mind using it with full-face mask, but he refers to stuffy nose, kids. I reviewed compliance goals and alternatives to CPAP. He is interested in exploring oral appliance therapy. He mentions exploring a claim against [facility] for [facility] sleep apnea and will request copy of records.  03/19/24- 41 yoM Veteran, former smoker followed for OSA, complicated by Ulcerative Colitis, Back Pain,  CPAP auto 5-15/ [facility] Download compliance-  Body weight today  We referred to Dr Micky in 2023 to consider OAP as alternative to CPAP.  ROS-see HPI  + = positive Constitutional:    weight loss, night sweats, fevers, chills, fatigue, lassitude. HEENT:    headaches, difficulty swallowing, tooth/dental problems, sore throat,       sneezing, itching, ear ache, nasal congestion, post nasal drip, snoring CV:    chest pain, orthopnea, PND, swelling in lower extremities, anasarca,                                   dizziness, palpitations Resp:   shortness of breath with exertion or at rest.                productive cough,   non-productive cough, coughing up of blood.              change in color of mucus.  wheezing.   Skin:    rash or lesions. GI:  No-   heartburn, indigestion, abdominal pain, nausea, vomiting, diarrhea,                 change in bowel habits, loss of appetite GU: dysuria, change in color of urine, no urgency or frequency.   flank pain. MS:   joint pain, stiffness, decreased range of motion, back pain. Neuro-     nothing unusual Psych:  change in mood or affect.  depression or anxiety.   memory loss.  OBJ- Physical Exam General- Alert, Oriented,  Affect-appropriate, Distress- none acute Skin- rash-none, lesions- none, excoriation- none Lymphadenopathy- none Head- atraumatic            Eyes- Gross vision intact, PERRLA, conjunctivae and secretions clear            Ears- Hearing, canals-normal            Nose- No obvious congestion,  no-Septal dev, mucus, polyps, erosion, perforation             Throat- Mallampati III-IV , mucosa clear , drainage- none, tonsils- atrophic, +teeth Neck- flexible , trachea midline, no stridor , thyroid nl, carotid no bruit Chest - symmetrical excursion , unlabored           Heart/CV- RRR , no murmur , no gallop  , no rub, nl s1 s2                           - JVD- none , edema- none, stasis changes- none, varices- none           Lung- clear to P&A, wheeze- none, cough- none , dullness-none, rub- none  Chest wall-  Abd-  Br/ Gen/ Rectal- Not done, not indicated Extrem- cyanosis- none, clubbing, none, atrophy- none, strength- nl Neuro- grossly intact to observation

## 2024-03-19 ENCOUNTER — Ambulatory Visit: Admitting: Internal Medicine

## 2024-08-13 ENCOUNTER — Ambulatory Visit: Admitting: Primary Care

## 2024-08-13 ENCOUNTER — Ambulatory Visit: Payer: Self-pay | Admitting: Primary Care

## 2024-08-13 VITALS — BP 116/68 | HR 76 | Temp 98.6°F | Ht 68.0 in | Wt 210.0 lb

## 2024-08-13 DIAGNOSIS — Z7984 Long term (current) use of oral hypoglycemic drugs: Secondary | ICD-10-CM

## 2024-08-13 DIAGNOSIS — E1165 Type 2 diabetes mellitus with hyperglycemia: Secondary | ICD-10-CM

## 2024-08-13 LAB — POCT GLYCOSYLATED HEMOGLOBIN (HGB A1C): Hemoglobin A1C: 6.5 % — AB (ref 4.0–5.6)

## 2024-08-13 NOTE — Progress Notes (Signed)
 Subjective:    Patient ID: Craig Rios, male    DOB: [birth year], 43 y.o.   MRN: [id number]  Craig Rios is a very pleasant 43 y.o. male with a history of type 2 diabetes, OSA, ulcerative colitis who presents today for follow-up diabetes  1) Type 2 Diabetes:  Current medications include: metformin  ER 500 mg daily  He is checking his blood glucose once weekly and is getting readings ranging 80- low 100s.   Last A1C: 6.6 in June 2025, 6.5 today Last Eye Exam: Due Last Foot Exam: UTD Pneumonia Vaccination: Never completed   Urine Microalbumin: UTD Statin: None  Dietary changes since last visit: He's cut out sweet tea and soda. He is eating more at home.    Exercise: None   BP Readings from Last 3 Encounters:  08/13/24 116/68  02/12/24 102/64  11/06/23 116/74      Review of Systems  Respiratory:  Negative for shortness of breath.   Cardiovascular:  Negative for chest pain.  Gastrointestinal:  Negative for abdominal pain and diarrhea.  Neurological:  Positive for numbness. Negative for dizziness.         Past Medical History:  Diagnosis Date   Blood in stool    Lower extremity edema 03/06/2020   Ulcerative colitis (HCC)     Social History   Socioeconomic History   Marital status: Single    Spouse name: Not on file   Number of children: Not on file   Years of education: Not on file   Highest education level: Associate degree: academic program  Occupational History   Not on file  Tobacco Use   Smoking status: Former    Current packs/day: 0.50    Average packs/day: 0.5 packs/day for 15.0 years (7.5 ttl pk-yrs)    Types: Cigarettes   Smokeless tobacco: Never  Vaping Use   Vaping status: Never Used  Substance and Sexual Activity   Alcohol use: Yes   Drug use: No   Sexual activity: Not on file  Other Topics Concern   Not on file  Social History Narrative   Single.   Works for [employer].   2 children.   Social Drivers of Manufacturing Engineer Strain: Low Risk  (08/12/2024)   Overall Financial Resource Strain (CARDIA)    Difficulty of Paying Living Expenses: Not hard at all  Food Insecurity: No Food Insecurity (08/12/2024)   Hunger Vital Sign    Worried About Running Out of Food in the Last Year: Never true    Ran Out of Food in the Last Year: Never true  Transportation Needs: No Transportation Needs (08/12/2024)   PRAPARE - Administrator, Civil Service (Medical): No    Lack of Transportation (Non-Medical): No  Physical Activity: Insufficiently Active (08/12/2024)   Exercise Vital Sign    Days of Exercise per Week: 1 day    Minutes of Exercise per Session: 30 min  Stress: Stress Concern Present (08/12/2024)   [name] of Occupational Health - Occupational Stress Questionnaire    Feeling of Stress: To some extent  Social Connections: Socially Isolated (08/12/2024)   Social Connection and Isolation Panel    Frequency of Communication with Friends and Family: Once a week    Frequency of Social Gatherings with Friends and Family: Once a week    Attends Religious Services: Never    Database Administrator or Organizations: No    Attends Banker Meetings: Not on file  Marital Status: Living with partner  Intimate Partner Violence: Not on file    Past Surgical History:  Procedure Laterality Date   WISDOM TOOTH EXTRACTION      Family History  Problem Relation Age of Onset   Stroke Mother    Heart attack Father     No Known Allergies  Current Outpatient Medications on File Prior to Visit  Medication Sig Dispense Refill   Blood Glucose Monitoring Suppl DEVI 1 each by Does not apply route in the morning, at noon, and at bedtime. May substitute to any manufacturer covered by patient's insurance. 1 each 0   Glucose Blood (BLOOD GLUCOSE TEST STRIPS) STRP 1 each by In Vitro route in the morning, at noon, and at bedtime. May substitute to any manufacturer covered by patient's  insurance. 100 strip 4   Lancets Misc. MISC 1 each by Does not apply route in the morning, at noon, and at bedtime. May substitute to any manufacturer covered by patient's insurance. 100 each 4   mesalamine (LIALDA) 1.2 G EC tablet Take 1,200 mg by mouth 4 (four) times daily.     metFORMIN  (GLUCOPHAGE -XR) 500 MG 24 hr tablet Take 1 tablet (500 mg total) by mouth daily with breakfast. for diabetes. 90 tablet 1   triamcinolone  ointment (KENALOG ) 0.5 % Apply 1 Application topically 2 (two) times daily as needed. (Patient not taking: Reported on 08/13/2024) 30 g 0   No current facility-administered medications on file prior to visit.    BP 116/68   Pulse 76   Temp 98.6 F (37 C) (Oral)   Ht 5' 8 (1.727 m)   Wt 210 lb (95.3 kg)   SpO2 98%   BMI 31.93 kg/m  Objective:   Physical Exam Cardiovascular:     Rate and Rhythm: Normal rate and regular rhythm.  Pulmonary:     Effort: Pulmonary effort is normal.     Breath sounds: Normal breath sounds.  Musculoskeletal:     Cervical back: Neck supple.  Skin:    General: Skin is warm and dry.  Neurological:     Mental Status: He is alert and oriented to person, place, and time.  Psychiatric:        Mood and Affect: Mood normal.     Physical Exam        Assessment & Plan:  Type 2 diabetes mellitus with hyperglycemia, without long-term current use of insulin (HCC) Assessment & Plan: Improved and controlled with A1c of 6.5 today.  Continue metformin  ER 500 mg daily. Continue work on lifestyle approaches.  He declines pneumonia vaccine. We discussed schedule eye exam  Follow up in 3 months for CPE  Orders: -     POCT glycosylated hemoglobin (Hb A1C)    Assessment and Plan Assessment & Plan         Comer MARLA Gaskins, NP

## 2024-08-13 NOTE — Assessment & Plan Note (Signed)
 Improved and controlled with A1c of 6.5 today.  Continue metformin  ER 500 mg daily. Continue work on lifestyle approaches.  He declines pneumonia vaccine. We discussed schedule eye exam  Follow up in 3 months for CPE

## 2024-08-13 NOTE — Patient Instructions (Signed)
 Continue to work on healthy lifestyle changes.  Continue metformin  for diabetes.  Please schedule a physical to meet with me in 3 months.   It was a pleasure to see you today!

## 2024-10-13 ENCOUNTER — Other Ambulatory Visit (HOSPITAL_COMMUNITY): Payer: Self-pay

## 2024-10-13 ENCOUNTER — Telehealth: Payer: Self-pay

## 2024-10-13 NOTE — Telephone Encounter (Signed)
 Pharmacy Patient Advocate Encounter   Received notification from [facility] KEY that prior authorization for [facility] Precision Neo TS is required/requested.   Insurance verification completed.   The patient is insured through [employer].   Per test claim: PA required; PA submitted to above mentioned insurance via Latent Key/confirmation #/EOC [id number] Status is pending

## 2024-11-12 ENCOUNTER — Ambulatory Visit: Admitting: Primary Care
